# Patient Record
Sex: Female | Born: 1980 | Race: Black or African American | Hispanic: No | Marital: Single | Smoking: Never smoker
Health system: Southern US, Community
[De-identification: ages and names within clinical notes are randomized; demographics above are authoritative.]

## PROBLEM LIST (undated history)

## (undated) DIAGNOSIS — G43909 Migraine, unspecified, not intractable, without status migrainosus: Secondary | ICD-10-CM

---

## 2008-05-26 ENCOUNTER — Emergency Department (HOSPITAL_BASED_OUTPATIENT_CLINIC_OR_DEPARTMENT_OTHER): Admission: EM | Admit: 2008-05-26 | Discharge: 2008-05-26 | Payer: Self-pay | Admitting: Emergency Medicine

## 2009-07-15 ENCOUNTER — Emergency Department (HOSPITAL_BASED_OUTPATIENT_CLINIC_OR_DEPARTMENT_OTHER): Admission: EM | Admit: 2009-07-15 | Discharge: 2009-07-15 | Payer: Self-pay | Admitting: Emergency Medicine

## 2010-12-24 ENCOUNTER — Emergency Department (HOSPITAL_BASED_OUTPATIENT_CLINIC_OR_DEPARTMENT_OTHER)
Admission: EM | Admit: 2010-12-24 | Discharge: 2010-12-24 | Disposition: A | Discharge status: Home / Self Care | Payer: Self-pay | Attending: Emergency Medicine | Admitting: Emergency Medicine

## 2010-12-24 DIAGNOSIS — B373 Candidiasis of vulva and vagina: Secondary | ICD-10-CM | POA: Insufficient documentation

## 2010-12-24 DIAGNOSIS — R109 Unspecified abdominal pain: Secondary | ICD-10-CM | POA: Insufficient documentation

## 2010-12-24 DIAGNOSIS — B3731 Acute candidiasis of vulva and vagina: Secondary | ICD-10-CM | POA: Insufficient documentation

## 2010-12-24 LAB — URINALYSIS, ROUTINE W REFLEX MICROSCOPIC
Bilirubin Urine: NEGATIVE
Hgb urine dipstick: NEGATIVE
Nitrite: NEGATIVE
Protein, ur: NEGATIVE mg/dL
Specific Gravity, Urine: 1.021 (ref 1.005–1.030)
Urobilinogen, UA: 0.2 mg/dL (ref 0.0–1.0)

## 2010-12-24 LAB — WET PREP, GENITAL

## 2010-12-25 LAB — GC/CHLAMYDIA PROBE AMP, GENITAL: GC Probe Amp, Genital: NEGATIVE

## 2013-12-20 ENCOUNTER — Other Ambulatory Visit (HOSPITAL_COMMUNITY): Payer: Self-pay | Admitting: Obstetrics and Gynecology

## 2013-12-20 DIAGNOSIS — Z3689 Encounter for other specified antenatal screening: Secondary | ICD-10-CM

## 2013-12-20 DIAGNOSIS — O289 Unspecified abnormal findings on antenatal screening of mother: Secondary | ICD-10-CM

## 2014-01-08 ENCOUNTER — Other Ambulatory Visit: Payer: Self-pay

## 2014-01-08 ENCOUNTER — Ambulatory Visit (HOSPITAL_COMMUNITY)
Admission: RE | Admit: 2014-01-08 | Discharge: 2014-01-08 | Disposition: A | Discharge status: Home / Self Care | Payer: Medicaid Other | Source: Ambulatory Visit | Attending: Obstetrics and Gynecology | Admitting: Obstetrics and Gynecology

## 2014-01-08 ENCOUNTER — Encounter (HOSPITAL_COMMUNITY): Payer: Self-pay

## 2014-01-08 DIAGNOSIS — O289 Unspecified abnormal findings on antenatal screening of mother: Secondary | ICD-10-CM

## 2014-01-08 DIAGNOSIS — IMO0002 Reserved for concepts with insufficient information to code with codable children: Secondary | ICD-10-CM | POA: Insufficient documentation

## 2014-01-08 DIAGNOSIS — Z3689 Encounter for other specified antenatal screening: Secondary | ICD-10-CM

## 2014-01-08 NOTE — Progress Notes (Signed)
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  They were counseled regarding the First trimester screen result and the associated 1 in 21 risk for fetal Down syndrome.  We reviewed chromosomes, nondisjunction, and the common features and variable prognosis of Down syndrome.  In addition, the screen reduced the risk for trisomy 18 (1 in 1131).  We also discussed other explanations for a screen positive result including: differences in maternal metabolism and normal variation. They understand that this screening is not diagnostic for Down syndrome but provides a risk assessment. Amber Olsen subsequently had noninvasive prenatal screening (NIPS) through her initial OB office [facility]). [facility] NIPS[facility] was within normal limits for the conditions screened, showing a less than 1 in 10,000 risk for trisomies 21, 18 and 13, and monosomy X (Turner syndrome).  In addition, the risk for triploidy/vanishing twin and sex chromosome trisomies (47,XXX and 47,XXY) was also low risk.  We reviewed that this testing identifies > 99% of pregnancies with trisomy 2821, trisomy 713, sex chromosome trisomies (47,XXX and 47,XXY), and triploidy. The detection rate for trisomy 18 is 96%.  The detection rate for monosomy X is ~92%.  The false positive rate is <0.1% for all conditions. Testing was also consistent with female fetal sex.   We reviewed that this screen is not diagnostic for these conditions, nor does it assess for all chromosome or genetic conditions.   Detailed ultrasound was performed at the time of today's visit. We reviewed that visualized fetal anatomy appeared normal. Complete ultrasound results reported separately. They were also counseled regarding diagnostic testing via amniocentesis. We reviewed the approximate 1 in 300-500 risk for complications for amniocentesis, including spontaneous pregnancy loss. After careful consideration, Amber Olsen declined amniocentesis in the pregnancy. They understand that screening tests cannot rule out all birth defects or genetic syndromes. The patient was advised of this limitation and states she still does not want additional testing at this time.   Amber Olsen had routine screening for hemoglobinopathies via hemoglobin electrophoresis and complete blood count based on her African-American ancestry on 07/03/13, which revealed a hemoglobin A of 92%, hemoglobin A2 of 5.4%, and hemoglobin F of 2.6%.  Her MCV value was 77.1 and her MCH was 23.8.  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   We reviewed both family histories in detail. Amber Olsen reported that her niece (her brother's daughter) has beta thalassemia. She is currently 31 years old and reportedly had worse symptoms in childhood compare to current health status. The patient reported that her brother and her mother have beta-thalassemia trait. Amber Olsen reported no known family history of sickle cell trait, thalassemia trait, or other hemoglobin variants. He has not had screening for hemoglobin variants at this time. Consanguinity between the couple was denied.   The couple was counseled that beta-thalassemia major is a genetic condition characterized by reduced synthesis of the beta  subunit of  hemoglobin (beta globin). We reviewed that hemoglobin is a protein in red blood cells that carries oxygen to the body's organs. There are multiple types of hemoglobin, and these are comprised of different subunits.  The primary adult hemoglobin, denoted Olsen hemoglobin A (Hb A) is made up of two alpha and two beta globin units. Individuals who have beta-thalassemia major have changes within the genes that code for beta globin. The resulting imbalance in the ratio of the alpha chains to the beta chains causes the underlying pathology. In beta-thalassemia, the alpha chains are produced in relative excess. Therefore, because of the absence of the beta chains with which to form a tetramer, the excess alpha chains will precipitate in the cell damaging the membrane and leading to premature RBC destruction. This results in hypochromic, microcytic anemia. The anemia is severe and typically leads to hepatosplenomegaly for individuals with beta thalassemia.  Because the beta chain is not used for hemoglobin production during fetal life, the onset of symptoms in beta-thalassemia is not until a few months of life. Beta thalassemia major is typically treated with regular transfusions and chelation therapy, aimed at reducing transfusion iron overload.  Without treatment, individuals with beta-thalassemia major have failure to thrive, feeding problems, and a shortened lifespan. Elevation of hemoglobin A2 (alpha2/delta2) occurs uniquely in beta-thalassemia carriers, and this is what is typically found on hemoglobin electrophoresis for beta thalassemia carriers. Continued production of the delta chains allows for tetramer formation between the alpha and delta chains. Beta-thalassemia produces a variable phenotype dependent upon the severity of the mutations.    The couple was counseled that beta-thalassemia is inherited in an autosomal recessive manner, and occurs when both copies of the beta hemoglobin gene are changed. Typically,  one abnormal beta globin gene is inherited from each parent. We discussed that beta thalassemia trait can be inherited with other beta globin chain variants, such Olsen sickle cell trait (Hb Olsen) to also cause other variant hemoglobinopathies, such Olsen sickle beta-thalassemia.  Beta-thalassemia minor occurs when an individual has one altered copy of the beta globin gene and one typical working copy of the beta globin gene.  This is also referred to Olsen being a "carrier" of beta-thalassemia.  Carriers of recessive conditions typically do not have symptoms related to the condition, because they still have one functioning copy of the gene, and thus some production of the typical protein coded for by that gene.   Given the recessive inheritance, we discussed the importance of understanding the carrier status of the father of the pregnancy in order to accurately predict the risk of beta-thalassemia or other hemoglobinopathy in the fetus. We discussed the option of hemoglobin electrophoresis with a complete blood count and serum ferritin levels to determine whether he has beta-thalassemia trait, or other hemoglobin variant (including sickle cell trait). We discussed that this screening can be facilitated through our office, likely his physician's office or the patient's OB office. Additionally, we discussed that carrier screening is provided through [facility] in [place].  She understands that an accurate risk assessment cannot be performed without further information.  We discussed that approximately 1 in 12 individuals with African-American ancestry have beta-thalassemia minor or sickle cell trait.  This considered, the risk for beta-thalassemia or other hemoglobinopathy in the fetus is approximately 1 in 89 [1/12 (chance that the father of the pregnancy is a carrier) x 1 (chance that Amber Olsen is carrier) x  (chance that both would pass  on the changed gene)].  The father  of the pregnancy declined testing at the time of today's visit and stated that he may pursue this at a later date. If the father of the pregnancy has typical hemoglobin, then the pregnancy would not be at risk to inherit a hemoglobinopathy but would have a 1 in 2 chance to have beta-thalassemia trait like Amber Olsen.   We discussed that in the case that both parents are identified to be carriers for hemoglobin variants, prenatal diagnosis via amniocentesis would be available, if desired. In the case of beta-thalassemia trait, molecular testing would first need to be performed to identify the causative mutation in the HBB gene prior to CVS or amniocentesis, given that >200 pathogenic variants have been identified in the HBB gene. Hemoglobin S (or sickle cell trait) is encoded by a specific point mutation (Glu6Val) in the HBB gene.  HBB gene sequencing is available to Amber Olsen to assess for the causative gene change for her beta thalassemia trait. HBB sequencing reportedly identifies a causative mutation in 99% of individuals with beta thalassemia. The patient understands that targeted ultrasound in pregnancy would not be expected to see physical differences related to the presence or absence of a hemoglobinopathy. We also reviewed the availability of newborn screening in [place] for hemoglobinopathies. Amber Olsen again indicated that she would not be interested in amniocentesis in the pregnancy, even in the event that the father of the pregnancy was determined to carry a hemoglobin variant.   Both family histories were reviewed and found to be otherwise contributory for a female maternal first cousin to the father of the pregnancy (his maternal aunt's son) with autism. The etiology is unknown. He is reportedly in his 78's and lives with his mother. No additional relatives were reported with autism, including this relative's two brothers. We discussed that autism is part of the spectrum of conditions  referred to Olsen Autistic spectrum disorders (ASD). We discussed that ASDs are among the most common neurodevelopmental disorders, with approximately 1 in 88 children meeting criteria for ASD. Approximately 80% of individuals diagnosed are female. There is strong evidence that genetic factors play a critical role in development of ASD. There have been recent advances in identifying specific genetic causes of ASD, however, there are still many individuals for whom the etiology of the ASD is not known. Once a family has a child with a diagnosis of ASD, there is a 13.5% chance to have another child with ASD. Data are limited regarding recurrence risk estimate for extended degree relatives. However, given the degree of relation, we discussed that recurrence risk would likely be similar to the general population risk, in the case of multifactorial inheritance. They understand that at this time there is not genetic testing available for ASD for most families. Without further information regarding the provided family history, an accurate genetic risk cannot be calculated. Further genetic counseling is warranted if more information is obtained.  Amber Olsen denied exposure to environmental toxins or chemical agents. She denied the use of alcohol, tobacco or street drugs. She denied significant viral illnesses during the course of her pregnancy. Her medical and surgical histories were noncontributory.   I counseled this couple for approximately 30 minutes regarding the above risks and available options.   Helyn App Jeni Salles, MS,  Certified Genetic Counselor  01/08/2014

## 2014-06-03 ENCOUNTER — Encounter (HOSPITAL_COMMUNITY): Payer: Self-pay

## 2014-11-13 ENCOUNTER — Encounter (HOSPITAL_COMMUNITY): Payer: Self-pay | Admitting: *Deleted

## 2015-12-27 ENCOUNTER — Emergency Department (HOSPITAL_BASED_OUTPATIENT_CLINIC_OR_DEPARTMENT_OTHER): Payer: Medicaid Other

## 2015-12-27 ENCOUNTER — Emergency Department (HOSPITAL_BASED_OUTPATIENT_CLINIC_OR_DEPARTMENT_OTHER)
Admission: EM | Admit: 2015-12-27 | Discharge: 2015-12-28 | Disposition: A | Discharge status: Home / Self Care | Payer: Medicaid Other | Attending: Emergency Medicine | Admitting: Emergency Medicine

## 2015-12-27 ENCOUNTER — Encounter (HOSPITAL_BASED_OUTPATIENT_CLINIC_OR_DEPARTMENT_OTHER): Payer: Self-pay | Admitting: *Deleted

## 2015-12-27 DIAGNOSIS — S60221A Contusion of right hand, initial encounter: Secondary | ICD-10-CM

## 2015-12-27 DIAGNOSIS — S61411A Laceration without foreign body of right hand, initial encounter: Secondary | ICD-10-CM

## 2015-12-27 DIAGNOSIS — Y929 Unspecified place or not applicable: Secondary | ICD-10-CM | POA: Insufficient documentation

## 2015-12-27 DIAGNOSIS — S66921A Laceration of unspecified muscle, fascia and tendon at wrist and hand level, right hand, initial encounter: Secondary | ICD-10-CM | POA: Insufficient documentation

## 2015-12-27 DIAGNOSIS — Y999 Unspecified external cause status: Secondary | ICD-10-CM | POA: Insufficient documentation

## 2015-12-27 DIAGNOSIS — W268XXA Contact with other sharp object(s), not elsewhere classified, initial encounter: Secondary | ICD-10-CM | POA: Insufficient documentation

## 2015-12-27 DIAGNOSIS — Y93G1 Activity, food preparation and clean up: Secondary | ICD-10-CM | POA: Insufficient documentation

## 2015-12-27 NOTE — ED Notes (Signed)
MD Delo at bedside. 

## 2015-12-27 NOTE — ED Notes (Signed)
Pt. returned from XR. 

## 2015-12-27 NOTE — ED Provider Notes (Signed)
CSN: [id number]     Arrival date & time 12/27/15  2228 History  By signing my name below, I, Bridgette Habermann and Doreatha Martin, attest that this documentation has been prepared under the direction and in the presence of Geoffery Lyons, MD. Electronically Signed: Bridgette Habermann and Doreatha Martin, ED Scribe. 12/27/2015. 11:51 PM.      Chief Complaint  Patient presents with  . Hand Injury   Patient is a 35 y.o. female presenting with skin laceration. The history is provided by the patient. No language interpreter was used.  Laceration Location:  Hand Hand laceration location:  R hand and dorsum of R hand Length (cm):  2.5  Quality: straight   Bleeding: controlled   Time since incident:  3 hours Laceration mechanism:  Metal edge Pain details:    Quality:  Aching   Severity:  Moderate   Timing:  Constant Foreign body present:  No foreign bodies Relieved by:  Pressure Ineffective treatments:  None tried Tetanus status:  Out of date   HPI Comments: Amber Olsen is a 35 y.o. female who presents to the Emergency Department complaining of a laceration with controlled bleeding to the dorsum of the right hand that occurred 3 hours ago. Pt states she was cleaning a metal stove when a metal edge cut the dorsum of her right hand. Bleeding controlled with pressure dressing applied PTA. Tdap out of date. She denies numbness, weakness, paresthesia, additional injuries.   History reviewed. No pertinent past medical history. History reviewed. No pertinent past surgical history. History reviewed. No pertinent family history. Social History  Substance Use Topics  . Smoking status: Never Smoker   . Smokeless tobacco: None  . Alcohol Use: No   OB History    Gravida Para Term Preterm AB TAB SAB Ectopic Multiple Living   0 0 0 1     Review of Systems  Skin: Positive for wound.  Neurological: Negative for weakness and numbness.       -paresthesia  All other systems reviewed and are  negative.  Allergies  Review of patient's allergies indicates no known allergies.  Home Medications   Prior to Admission medications   Not on File   BP 129/83 mmHg  Pulse 82  Temp(Src) 98.9 F (37.2 C) (Oral)  Resp 20  Ht  (1.676 m)  Wt 150 lb (68.04 kg)  BMI 24.22 kg/m2  SpO2 100%  LMP 12/02/2015 (Exact Date) Physical Exam  Constitutional: She is oriented to person, place, and time. She appears well-developed and well-nourished.  HENT:  Head: Normocephalic and atraumatic.  Eyes: Pupils are equal, round, and reactive to light.  Neck: Normal range of motion. Neck supple.  Cardiovascular: Normal rate, regular rhythm and normal heart sounds.   Pulmonary/Chest: Effort normal and breath sounds normal. No respiratory distress. She has no wheezes. She has no rales. She exhibits no tenderness.  Abdominal: Soft. Bowel sounds are normal. There is no tenderness. There is no rebound and no guarding.  Musculoskeletal: Normal range of motion. She exhibits no edema.  Right hand has swelling to the dorsal aspect. There is a small 2.5 cm laceration to the area above the distal third and fourth metacarpals   Lymphadenopathy:    She has no cervical adenopathy.  Neurological: She is alert and oriented to person, place, and time.  Skin: Skin is warm and dry. No rash noted.  Psychiatric: She has a normal mood and affect.  Nursing note and  vitals reviewed.   ED Course  Procedures (including critical care time) DIAGNOSTIC STUDIES: Oxygen Saturation is 100% on RA, normal by my interpretation.    COORDINATION OF CARE: 11:48 PM Discussed treatment plan with pt at bedside which includes XR and pt agreed to plan.  LACERATION REPAIR PROCEDURE NOTE The patient's identification was confirmed and consent was obtained. This procedure was performed by Geoffery Lyonsouglas Emer Onnen, MD at 12:29 AM. Site: right hand Sterile procedures observed Anesthetic used (type and amt): 3 mL 1% lidocaine without epi Suture  type/size: 4-0 prolene  Length:2.5 cm  # of Sutures: 3 Technique: simple interrupted Complexity: simple Antibx ointment applied Tetanus ordered Site anesthetized, irrigated with NS, explored without evidence of foreign body, wound well approximated, site covered with dry, sterile dressing.  Patient tolerated procedure well without complications. Instructions for care discussed verbally and patient provided with additional written instructions for homecare and f/u.    Imaging Review Dg Hand Complete Right  12/28/2015  CLINICAL DATA:  Acute onset of right hand injury while cleaning stove, with diffuse swelling and small laceration at the proximal fourth metacarpal. Initial encounter. EXAM: RIGHT HAND - COMPLETE 3+ VIEW COMPARISON:  None. FINDINGS: There is no evidence of fracture or dislocation. The joint spaces are preserved. The carpal rows are intact, and demonstrate normal alignment. Diffuse dorsal soft tissue swelling is noted. No radiopaque foreign bodies are seen. IMPRESSION: No evidence of fracture or dislocation. No radiopaque foreign bodies seen. Electronically Signed   By: Roanna RaiderJeffery  Chang M.D.   On: 12/28/2015 00:09   I have personally reviewed and evaluated these images as part of my medical decision-making.   MDM   Final diagnoses:  Hand laceration, right, initial encounter  Hand contusion, right, initial encounter    The wound was copiously irrigated and closed with sutures as above. She will be discharged with local wound care and when necessary return. Sutures are to be removed in 7 days.  I asked the patient several times whether or not this was a result of punching someone and the laceration was from another individual's teeth. She repeatedly assured me that it was not.  I personally performed the services described in this documentation, which was scribed in my presence. The recorded information has been reviewed and is accurate.       Geoffery Lyonsouglas Serrita Lueth, MD 12/28/15  [phone]

## 2015-12-27 NOTE — ED Notes (Signed)
Per pt report cleaning the stove and cough the top of rt hand. Swelling present .

## 2015-12-28 MED ORDER — TETANUS-DIPHTH-ACELL PERTUSSIS 5-2.5-18.5 LF-MCG/0.5 IM SUSP
0.5000 mL | Freq: Once | INTRAMUSCULAR | Status: AC
  Administered 2015-12-28: 0.5 mL via INTRAMUSCULAR
  Filled 2015-12-28: qty 0.5

## 2015-12-28 MED ORDER — LIDOCAINE HCL 1 % IJ SOLN
INTRAMUSCULAR | Status: AC
  Filled 2015-12-28: qty 20

## 2015-12-28 MED ORDER — LIDOCAINE HCL (PF) 1 % IJ SOLN: 5.0000 mL | Freq: Once | INTRAMUSCULAR | Status: DC

## 2015-12-28 MED ORDER — TRAMADOL HCL 50 MG PO TABS: 50.0000 mg | ORAL_TABLET | Freq: Four times a day (QID) | ORAL | Status: DC | PRN

## 2015-12-28 NOTE — ED Notes (Signed)
Pt given d/c instructions as per chart. Verbalizes understanding. No questions. 

## 2015-12-28 NOTE — Discharge Instructions (Signed)
Local wound care with bacitracin and dressing changes twice daily.  Tramadol as prescribed as needed for pain.  Sutures are to be removed in 7 days.  Return to the emergency department for increased swelling, redness, pus straining from the wound, or increased pain.     Laceration Care, Adult A laceration is a cut that goes through all of the layers of the skin and into the tissue that is right under the skin. Some lacerations heal on their own. Others need to be closed with stitches (sutures), staples, skin adhesive strips, or skin glue. Proper laceration care minimizes the risk of infection and helps the laceration to heal better. HOW TO CARE FOR YOUR LACERATION If sutures or staples were used:  Keep the wound clean and dry.  If you were given a bandage (dressing), you should change it at least one time per day or as told by your health care provider. You should also change it if it becomes wet or dirty.  Keep the wound completely dry for the first 24 hours or as told by your health care provider. After that time, you may shower or bathe. However, make sure that the wound is not soaked in water until after the sutures or staples have been removed.  Clean the wound one time each day or as told by your health care provider:  Wash the wound with soap and water.  Rinse the wound with water to remove all soap.  Pat the wound dry with a clean towel. Do not rub the wound.  After cleaning the wound, apply a thin layer of antibiotic ointmentas told by your health care provider. This will help to prevent infection and keep the dressing from sticking to the wound.  Have the sutures or staples removed as told by your health care provider. If skin adhesive strips were used:  Keep the wound clean and dry.  If you were given a bandage (dressing), you should change it at least one time per day or as told by your health care provider. You should also change it if it becomes dirty or wet.  Do  not get the skin adhesive strips wet. You may shower or bathe, but be careful to keep the wound dry.  If the wound gets wet, pat it dry with a clean towel. Do not rub the wound.  Skin adhesive strips fall off on their own. You may trim the strips as the wound heals. Do not remove skin adhesive strips that are still stuck to the wound. They will fall off in time. If skin glue was used:  Try to keep the wound dry, but you may briefly wet it in the shower or bath. Do not soak the wound in water, such as by swimming.  After you have showered or bathed, gently pat the wound dry with a clean towel. Do not rub the wound.  Do not do any activities that will make you sweat heavily until the skin glue has fallen off on its own.  Do not apply liquid, cream, or ointment medicine to the wound while the skin glue is in place. Using those may loosen the film before the wound has healed.  If you were given a bandage (dressing), you should change it at least one time per day or as told by your health care provider. You should also change it if it becomes dirty or wet.  If a dressing is placed over the wound, be careful not to apply tape directly over  the skin glue. Doing that may cause the glue to be pulled off before the wound has healed.  Do not pick at the glue. The skin glue usually remains in place for 5-10 days, then it falls off of the skin. General Instructions  Take over-the-counter and prescription medicines only as told by your health care provider.  If you were prescribed an antibiotic medicine or ointment, take or apply it as told by your doctor. Do not stop using it even if your condition improves.  To help prevent scarring, make sure to cover your wound with sunscreen whenever you are outside after stitches are removed, after adhesive strips are removed, or when glue remains in place and the wound is healed. Make sure to wear a sunscreen of at least 30 SPF.  Do not scratch or pick at the  wound.  Keep all follow-up visits as told by your health care provider. This is important.  Check your wound every day for signs of infection. Watch for:  Redness, swelling, or pain.  Fluid, blood, or pus.  Raise (elevate) the injured area above the level of your heart while you are sitting or lying down, if possible. SEEK MEDICAL CARE IF:  You received a tetanus shot and you have swelling, severe pain, redness, or bleeding at the injection site.  You have a fever.  A wound that was closed breaks open.  You notice a bad smell coming from your wound or your dressing.  You notice something coming out of the wound, such as wood or glass.  Your pain is not controlled with medicine.  You have increased redness, swelling, or pain at the site of your wound.  You have fluid, blood, or pus coming from your wound.  You notice a change in the color of your skin near your wound.  You need to change the dressing frequently due to fluid, blood, or pus draining from the wound.  You develop a new rash.  You develop numbness around the wound. SEEK IMMEDIATE MEDICAL CARE IF:  You develop severe swelling around the wound.  Your pain suddenly increases and is severe.  You develop painful lumps near the wound or on skin that is anywhere on your body.  You have a red streak going away from your wound.  The wound is on your hand or foot and you cannot properly move a finger or toe.  The wound is on your hand or foot and you notice that your fingers or toes look pale or bluish.   This information is not intended to replace advice given to you by your health care provider. Make sure you discuss any questions you have with your health care provider.   Document Released: 07/19/2005 Document Revised: 12/03/2014 Document Reviewed: 07/15/2014 Elsevier Interactive Patient Education [employer]

## 2016-01-04 ENCOUNTER — Emergency Department (HOSPITAL_BASED_OUTPATIENT_CLINIC_OR_DEPARTMENT_OTHER)
Admission: EM | Admit: 2016-01-04 | Discharge: 2016-01-04 | Disposition: A | Discharge status: Home / Self Care | Payer: Medicaid Other | Attending: Emergency Medicine | Admitting: Emergency Medicine

## 2016-01-04 ENCOUNTER — Encounter (HOSPITAL_BASED_OUTPATIENT_CLINIC_OR_DEPARTMENT_OTHER): Payer: Self-pay | Admitting: *Deleted

## 2016-01-04 DIAGNOSIS — Z4802 Encounter for removal of sutures: Secondary | ICD-10-CM | POA: Insufficient documentation

## 2016-01-04 DIAGNOSIS — Z4889 Encounter for other specified surgical aftercare: Secondary | ICD-10-CM

## 2016-01-04 NOTE — ED Notes (Signed)
Pt given d/c instructions as per chart. Verbalizes understanding. No questions. 

## 2016-01-04 NOTE — Discharge Instructions (Signed)
Keep wound covered during day time. You can wash routinely. Return for worsening symptoms, including fever, increased redness/swelling, pus drainage, worsening pain, or any other symptoms concerning to you.  Incision Care An incision is when a surgeon cuts into your body. After surgery, the incision needs to be cared for properly to prevent infection.  HOW TO CARE FOR YOUR INCISION  Take medicines only as directed by your health care provider.  There are many different ways to close and cover an incision, including stitches, skin glue, and adhesive strips. Follow your health care provider's instructions on:  Incision care.  Bandage (dressing) changes and removal.  Incision closure removal.  Do not take baths, swim, or use a hot tub until your health care provider approves. You may shower as directed by your health care provider.  Resume your normal diet and activities as directed.  Use anti-itch medicine (such as an antihistamine) as directed by your health care provider. The incision may itch while it is healing. Do not pick or scratch at the incision.  Drink enough fluid to keep your urine clear or pale yellow. SEEK MEDICAL CARE IF:   You have drainage, redness, swelling, or pain at your incision site.  You have muscle aches, chills, or a general ill feeling.  You notice a bad smell coming from the incision or dressing.  Your incision edges separate after the sutures, staples, or skin adhesive strips have been removed.  You have persistent nausea or vomiting.  You have a fever.  You are dizzy. SEEK IMMEDIATE MEDICAL CARE IF:   You have a rash.  You faint.  You have difficulty breathing. MAKE SURE YOU:   Understand these instructions.  Will watch your condition.  Will get help right away if you are not doing well or get worse.   This information is not intended to replace advice given to you by your health care provider. Make sure you discuss any questions you  have with your health care provider.   Document Released: 02/05/2005 Document Revised: 08/09/2014 Document Reviewed: 09/12/2013 Elsevier Interactive Patient Education [employer]2016 Elsevier Inc.

## 2016-01-04 NOTE — ED Provider Notes (Signed)
CSN: [id number]     Arrival date & time 01/04/16  1411 History   First MD Initiated Contact with Patient 01/04/16 1428     Chief Complaint  Patient presents with  . Suture / Staple Removal     (Consider location/radiation/quality/duration/timing/severity/associated sxs/prior Treatment) HPI 35 year old female who presents for suture removal. No significant PMH. Presented to ED 12/27/2015 for laceration sustained to the dorsum of the right hand. Had laceration repair in the ED. Returns for suture removal. No significant pain, swelling, increased redness, drainage, fever or chills. NO numbness or weakness or limitation of range of motion.   History reviewed. No pertinent past medical history. History reviewed. No pertinent past surgical history. History reviewed. No pertinent family history. Social History  Substance Use Topics  . Smoking status: Never Smoker   . Smokeless tobacco: None  . Alcohol Use: No   OB History    Gravida Para Term Preterm AB TAB SAB Ectopic Multiple Living   0 0 0 1     Review of Systems  Constitutional: Negative for fever.  Musculoskeletal: Negative for arthralgias.  Skin: Positive for wound.  Allergic/Immunologic: Negative for immunocompromised state.  Hematological: Does not bruise/bleed easily.  All other systems reviewed and are negative.     Allergies  Review of patient's allergies indicates no known allergies.  Home Medications   Prior to Admission medications   Medication Sig Start Date End Date Taking? Authorizing Provider  traMADol (ULTRAM) 50 MG tablet Take 1 tablet (50 mg total) by mouth every 6 (six) hours as needed. 12/28/15   Geoffery Lyons, MD   BP 146/92 mmHg  Pulse 73  Temp(Src) 98.9 F (37.2 C) (Oral)  Resp 18  Ht  (1.676 m)  Wt 150 lb (68.04 kg)  BMI 24.22 kg/m2  SpO2 100%  LMP 12/02/2015 (Exact Date) Physical Exam  Physical Exam  Nursing note and vitals reviewed. Constitutional: Well developed, well  nourished, non-toxic, and in no acute distress Head: Normocephalic and atraumatic.  Mouth/Throat: Oropharynx is clear and moist.  Neck: Normal range of motion. Neck supple.  Cardiovascular: Normal rate and regular rhythm.   Pulmonary/Chest: Effort normal and breath sounds normal.  Abdominal: Soft. There is no tenderness. There is no rebound and no guarding.  Musculoskeletal: Normal range of motion.  Neurological: Alert, no facial droop, fluent speech, moves all extremities symmetrically, in tact sensation to light touch throughout, full innervation involving median, ulnar, and radial nerves of the right hand Skin: Skin is warm and dry. 2 cm v-shaped laceration over the dorsum of the right hand distal to 3rd/4th MCPs. 3 sutures in place. No drainage, surrounding erythema or swelling. Psychiatric: Cooperative   ED Course  .Suture Removal Date/Time: 01/04/2016 3:59 PM Performed by: Crista Curb DUO Authorized by: Crista Curb DUO Consent: Verbal consent obtained. Risks and benefits: risks, benefits and alternatives were discussed Consent given by: patient Body area: upper extremity Location details: right hand Wound Appearance: clean Sutures Removed: 3 Post-removal: dressing applied Facility: sutures placed in this facility Patient tolerance: Patient tolerated the procedure well with no immediate complications   (including critical care time) Labs Review Labs Reviewed - No data to display  Imaging Review No results found. I have personally reviewed and evaluated these images and lab results as part of my medical decision-making.   EKG Interpretation None      MDM   Final diagnoses:  Suture check  Visit for suture removal  Hand laceration repaired 5/27 who presents with suture removal. No complications suspected. No evidence of cellulitis. Hand neurovascularly in tact and with full ROM in tact. Sutures removed. Strict return and follow-up instructions reviewed. She expressed  understanding of all discharge instructions and felt comfortable with the plan of care.     Lavera Guiseana Duo Vincient Vanaman, MD 01/04/16 [phone]

## 2016-01-04 NOTE — ED Notes (Signed)
3 stitches in right hand that need to be removed today.  Site clean, dry and intact.

## 2016-09-13 ENCOUNTER — Emergency Department (HOSPITAL_BASED_OUTPATIENT_CLINIC_OR_DEPARTMENT_OTHER)
Admission: EM | Admit: 2016-09-13 | Discharge: 2016-09-13 | Disposition: A | Discharge status: Home / Self Care | Payer: 59 | Attending: Emergency Medicine | Admitting: Emergency Medicine

## 2016-09-13 ENCOUNTER — Emergency Department (HOSPITAL_BASED_OUTPATIENT_CLINIC_OR_DEPARTMENT_OTHER): Payer: 59

## 2016-09-13 ENCOUNTER — Encounter (HOSPITAL_BASED_OUTPATIENT_CLINIC_OR_DEPARTMENT_OTHER): Payer: Self-pay | Admitting: *Deleted

## 2016-09-13 DIAGNOSIS — J111 Influenza due to unidentified influenza virus with other respiratory manifestations: Secondary | ICD-10-CM

## 2016-09-13 DIAGNOSIS — R509 Fever, unspecified: Secondary | ICD-10-CM | POA: Diagnosis not present

## 2016-09-13 DIAGNOSIS — R69 Illness, unspecified: Secondary | ICD-10-CM

## 2016-09-13 DIAGNOSIS — R05 Cough: Secondary | ICD-10-CM | POA: Diagnosis not present

## 2016-09-13 DIAGNOSIS — M791 Myalgia: Secondary | ICD-10-CM | POA: Diagnosis not present

## 2016-09-13 DIAGNOSIS — J029 Acute pharyngitis, unspecified: Secondary | ICD-10-CM | POA: Diagnosis not present

## 2016-09-13 MED ORDER — IBUPROFEN 400 MG PO TABS
400.0000 mg | ORAL_TABLET | Freq: Once | ORAL | Status: AC
  Administered 2016-09-13: 400 mg via ORAL
  Filled 2016-09-13: qty 1

## 2016-09-13 MED ORDER — OSELTAMIVIR PHOSPHATE 75 MG PO CAPS: 75.0000 mg | ORAL_CAPSULE | Freq: Two times a day (BID) | ORAL | 0 refills | Status: DC

## 2016-09-13 NOTE — ED Notes (Addendum)
Patient c/o body aches, cough, fever & chills x3 days, treated with tylenol at home. Patient denies sick contacts. Patient is currently pain free. Patient is A&O x4, NAD noted.

## 2016-09-13 NOTE — Discharge Instructions (Signed)

## 2016-09-13 NOTE — ED Triage Notes (Signed)
Fever, cough, body aches and chills x 3 days.

## 2016-09-13 NOTE — ED Provider Notes (Signed)
[facility]   CSN: [id number] Arrival date & time: 09/13/16  1416  By signing my name below, I, Linna DarnerRussell Turner, attest that this documentation has been prepared under the direction and in the presence of Arthor CaptainAbigail Lowen Mansouri, PA-C. Electronically Signed: Linna Darnerussell Turner, Scribe. 09/13/2016. 5:23 PM.  History   Chief Complaint Chief Complaint  Patient presents with  . Fever    The history is provided by the patient. No language interpreter was used.     HPI Comments: Amber Olsen is a 36 y.o. female who presents to the Emergency Department complaining of a persistent fever (tmax 101.5) beginning three days ago. She reports associated cough, sore throat, and body aches. Pt has tried Tylenol Cold & Flu with transient relief of her symptoms. No known sick contacts with similar symptoms. Pt denies nausea, vomiting, or any other associated symptoms.  History reviewed. No pertinent past medical history.  There are no active problems to display for this patient.   History reviewed. No pertinent surgical history.  OB History    Gravida Para Term Preterm AB Living   4 1 1  0 2 1   SAB TAB Ectopic Multiple Live Births   1 1 0 0         Home Medications    Prior to Admission medications   Medication Sig Start Date End Date Taking? Authorizing Provider  traMADol (ULTRAM) 50 MG tablet Take 1 tablet (50 mg total) by mouth every 6 (six) hours as needed. 12/28/15   Geoffery Lyonsouglas Delo, MD    Family History No family history on file.  Social History Social History  Substance Use Topics  . Smoking status: Never Smoker  . Smokeless tobacco: Never Used  . Alcohol use No     Allergies   Patient has no known allergies.   Review of Systems Review of Systems  Constitutional: Positive for fever.  HENT: Positive for sore throat.   Respiratory: Positive for cough.   Gastrointestinal: Negative for nausea and vomiting.  Musculoskeletal: Positive for myalgias.      Physical Exam Updated Vital Signs BP 111/80 (BP Location: Left Arm)   Pulse 90   Temp 98.8 F (37.1 C)   Resp 20   Ht 5\' 6"  (1.676 m)   Wt 160 lb (72.6 kg)   LMP 08/30/2016   SpO2 100%   BMI 25.82 kg/m   Physical Exam  Constitutional: She is oriented to person, place, and time. She appears well-developed and well-nourished. No distress.  HENT:  Head: Normocephalic and atraumatic.  Eyes: Conjunctivae and EOM are normal.  Neck: Neck supple. No tracheal deviation present.  Cardiovascular: Normal rate.   Pulmonary/Chest: Effort normal. No respiratory distress.  Hyper-resonate in the left lower lobe.  Musculoskeletal: Normal range of motion.  Neurological: She is alert and oriented to person, place, and time.  Skin: Skin is warm and dry.  Psychiatric: She has a normal mood and affect. Her behavior is normal.  Nursing note and vitals reviewed.    ED Treatments / Results  Labs (all labs ordered are listed, but only abnormal results are displayed) Labs Reviewed - No data to display  EKG  EKG Interpretation None       Radiology No results found.  Procedures Procedures (including critical care time)  DIAGNOSTIC STUDIES: Oxygen Saturation is 100% on RA, normal by my interpretation.    COORDINATION OF CARE: 5:27 PM Discussed treatment plan with pt at bedside and pt agreed to plan.  Medications Ordered  in ED Medications  ibuprofen (ADVIL,MOTRIN) tablet 400 mg (400 mg Oral Given 09/13/16 1436)     Initial Impression / Assessment and Plan / ED Course  I have reviewed the triage vital signs and the nursing notes.  Pertinent labs & imaging results that were available during my care of the patient were reviewed by me and considered in my medical decision making (see chart for details).     Patient with symptoms consistent with influenza.  Vitals are stable, low-grade fever.  No signs of dehydration, tolerating PO's.  Per cdc recommendation will discharge with  Tamiflu.  The patient understands that symptoms are greater than the recommended 24-48 hour window of treatment.  Patient will be discharged with instructions to orally hydrate, rest, and use over-the-counter medications such as anti-inflammatories ibuprofen and Aleve for muscle aches and Tylenol for fever.  Patient will also be given a cough suppressant.    Final Clinical Impressions(s) / ED Diagnoses   Final diagnoses:  None    New Prescriptions New Prescriptions   No medications on file   I personally performed the services described in this documentation, which was scribed in my presence. The recorded information has been reviewed and is accurate.       Arthor Captain, PA-C 09/13/16 1759    Lyndal Pulley, MD 09/14/16 [phone]

## 2016-09-15 ENCOUNTER — Encounter: Payer: Self-pay | Admitting: *Deleted

## 2016-09-15 ENCOUNTER — Emergency Department (INDEPENDENT_AMBULATORY_CARE_PROVIDER_SITE_OTHER)
Admission: EM | Admit: 2016-09-15 | Discharge: 2016-09-15 | Disposition: A | Discharge status: Home / Self Care | Payer: 59 | Source: Home / Self Care | Attending: Family Medicine | Admitting: Family Medicine

## 2016-09-15 DIAGNOSIS — J111 Influenza due to unidentified influenza virus with other respiratory manifestations: Secondary | ICD-10-CM

## 2016-09-15 NOTE — ED Triage Notes (Signed)
Pt c/o fatigue and cough still from her flu dx 2 days ago. She reports temp 101.5 yesterday.

## 2016-09-15 NOTE — Discharge Instructions (Signed)

## 2016-09-15 NOTE — ED Provider Notes (Signed)
CSN: [id number]     Arrival date & time 09/15/16  1211 History   First MD Initiated Contact with Patient 09/15/16 1238     Chief Complaint  Patient presents with  . Fatigue  . Cough   (Consider location/radiation/quality/duration/timing/severity/associated sxs/prior Treatment) HPI  Amber Olsen is a 36 y.o. female presenting to UC with c/o continued fatigue, body aches, cough and congestion.  Fever Tmax 101.5*F yesterday.  Pt was seen at [facility] Emergency Department 2 days ago, dx with influenza. She did not start taking the recommended tamiflu due to concern with cost.  Denies feeling worse but states she does not feel much better. She has been taking acetaminophen intermittently. Minimal nausea. Denies vomiting or diarrhea. Denies chest pain or SOB.     History reviewed. No pertinent past medical history. History reviewed. No pertinent surgical history. History reviewed. No pertinent family history. Social History  Substance Use Topics  . Smoking status: Never Smoker  . Smokeless tobacco: Never Used  . Alcohol use No   OB History    Gravida Para Term Preterm AB Living   4 1 1  0 2 1   SAB TAB Ectopic Multiple Live Births   1 1 0 0       Review of Systems  Constitutional: Positive for chills, fatigue and fever.  HENT: Positive for congestion. Negative for ear pain, sore throat, trouble swallowing and voice change.   Respiratory: Positive for cough. Negative for shortness of breath.   Cardiovascular: Negative for chest pain and palpitations.  Gastrointestinal: Positive for nausea. Negative for abdominal pain, diarrhea and vomiting.  Musculoskeletal: Positive for arthralgias, back pain and myalgias.  Skin: Negative for rash.    Allergies  Patient has no known allergies.  Home Medications   Prior to Admission medications   Not on File   Meds Ordered and Administered this Visit  Medications - No data to display  BP 131/89 (BP Location: Left Arm)    Pulse 80   Temp 98.2 F (36.8 C) (Oral)   Resp 16   Ht 5\' 6"  (1.676 m)   Wt 160 lb (72.6 kg)   LMP 08/30/2016   SpO2 100%   BMI 25.82 kg/m  No data found.   Physical Exam  Constitutional: She is oriented to person, place, and time. She appears well-developed and well-nourished. No distress.  HENT:  Head: Normocephalic and atraumatic.  Right Ear: Tympanic membrane normal.  Left Ear: Tympanic membrane normal.  Nose: No mucosal edema. Right sinus exhibits no maxillary sinus tenderness and no frontal sinus tenderness. Left sinus exhibits no maxillary sinus tenderness and no frontal sinus tenderness.  Mouth/Throat: Uvula is midline, oropharynx is clear and moist and mucous membranes are normal.  Eyes: EOM are normal.  Neck: Normal range of motion. Neck supple.  Cardiovascular: Normal rate and regular rhythm.   Pulmonary/Chest: Effort normal and breath sounds normal. No stridor. No respiratory distress. She has no wheezes. She has no rales.  Abdominal: Soft. She exhibits no distension. There is no tenderness.  Musculoskeletal: Normal range of motion.  Lymphadenopathy:    She has no cervical adenopathy.  Neurological: She is alert and oriented to person, place, and time.  Skin: Skin is warm and dry. She is not diaphoretic.  Psychiatric: She has a normal mood and affect. Her behavior is normal.  Nursing note and vitals reviewed.   Urgent Care Course     Procedures (including critical care time)  Labs Review Labs Reviewed - No  data to display  Imaging Review Dg Chest 2 View  Result Date: 09/13/2016 CLINICAL DATA:  36 year old with three-day history of fever, cough, chills and body aches. EXAM: CHEST  2 VIEW COMPARISON:  None. FINDINGS: Cardiomediastinal silhouette unremarkable. Lungs clear. Bronchovascular markings normal. Pulmonary vascularity normal. No visible pleural effusions. No pneumothorax. Slight upper thoracic dextroscoliosis and thoracolumbar levoscoliosis. IMPRESSION:  No acute cardiopulmonary disease. Electronically Signed   By: Hulan Saashomas  Lawrence M.D.   On: 09/13/2016 17:42     MDM   1. Influenza    Pt presenting to UC for f/u after dx of influenza 2 days ago in ED. Medical records reviewed including negative CXR. Vitals: WNL today, O2 Sat 100% on RA. Pt is afebrile. Lungs: CTAB No evidence of secondary bacterial infection at this time. Pt requested FMLA papers be filled out. Forms filled out. Work note provided for today and tomorrow. Encouraged continued symptomatic treatment with acetaminophen, ibuprofen, fluids, and rest. F/u with PCP, resource guide provided, as needed.    Junius Finnerrin O'Malley, PA-C 09/15/16 1347

## 2016-12-21 ENCOUNTER — Emergency Department (HOSPITAL_BASED_OUTPATIENT_CLINIC_OR_DEPARTMENT_OTHER)
Admission: EM | Admit: 2016-12-21 | Discharge: 2016-12-22 | Disposition: A | Discharge status: Home / Self Care | Payer: 59 | Attending: Emergency Medicine | Admitting: Emergency Medicine

## 2016-12-21 ENCOUNTER — Encounter (HOSPITAL_BASED_OUTPATIENT_CLINIC_OR_DEPARTMENT_OTHER): Payer: Self-pay | Admitting: Emergency Medicine

## 2016-12-21 DIAGNOSIS — G43009 Migraine without aura, not intractable, without status migrainosus: Secondary | ICD-10-CM | POA: Diagnosis not present

## 2016-12-21 DIAGNOSIS — R42 Dizziness and giddiness: Secondary | ICD-10-CM | POA: Diagnosis present

## 2016-12-21 LAB — URINALYSIS, ROUTINE W REFLEX MICROSCOPIC
Bilirubin Urine: NEGATIVE
GLUCOSE, UA: NEGATIVE mg/dL
HGB URINE DIPSTICK: NEGATIVE
Ketones, ur: NEGATIVE mg/dL
LEUKOCYTES UA: NEGATIVE
Nitrite: NEGATIVE
PH: 7 (ref 5.0–8.0)
Protein, ur: NEGATIVE mg/dL
Specific Gravity, Urine: 1.013 (ref 1.005–1.030)

## 2016-12-21 LAB — PREGNANCY, URINE: PREG TEST UR: NEGATIVE

## 2016-12-21 MED ORDER — SODIUM CHLORIDE 0.9 % IV BOLUS (SEPSIS)
1000.0000 mL | Freq: Once | INTRAVENOUS | Status: AC
  Administered 2016-12-21: 1000 mL via INTRAVENOUS

## 2016-12-21 MED ORDER — KETOROLAC TROMETHAMINE 30 MG/ML IJ SOLN
30.0000 mg | Freq: Once | INTRAMUSCULAR | Status: AC
  Administered 2016-12-21: 30 mg via INTRAVENOUS
  Filled 2016-12-21: qty 1

## 2016-12-21 NOTE — ED Triage Notes (Signed)
Pt c/o HA, dizziness and blurred vision x3 days.

## 2016-12-21 NOTE — ED Notes (Signed)
Pt standing at 3 minutes BP-135/102    Pulse-77

## 2016-12-21 NOTE — ED Provider Notes (Signed)
[facility]   CSN: [id number] Arrival date & time: 12/21/16  2057  By signing my name below, I, Thelma Bargeick Cochran, attest that this documentation has been prepared under the direction and in the presence of Shir Bergman, Mayer Maskerourtney F, MD. Electronically Signed: Thelma Bargeick Cochran, Scribe. 12/21/16. 11:24 PM.  History   Chief Complaint Chief Complaint  Patient presents with  . Dizziness   The history is provided by the patient. No language interpreter was used.    HPI Comments: Amber Olsen is a 36 y.o. female with a PMHx of headaches who presents to the Emergency Department complaining of constant, gradually worsening HA since 3 days. She has associated dizziness that she describes as being off balance, blurred vision in both eyes that she describes as a dimness, HA, and mild numbness in both hands. She denies associated photophobia, nausea, vomiting and diarrhea. Pt denies wearing corrective lenses, any medications taken, and NKDA. Pt notes a FMHx of DM.Patient has a history of headaches. States that this is somewhat different. She denies worst headache of her life. Current pain is 4 out of 10.  History reviewed. No pertinent past medical history.  There are no active problems to display for this patient.   History reviewed. No pertinent surgical history.  OB History    Gravida Para Term Preterm AB Living   4 1 1  0 2 1   SAB TAB Ectopic Multiple Live Births   1 1 0 0         Home Medications    Prior to Admission medications   Not on File    Family History No family history on file.  Social History Social History  Substance Use Topics  . Smoking status: Never Smoker  . Smokeless tobacco: Never Used  . Alcohol use No     Allergies   Patient has no known allergies.   Review of Systems Review of Systems  Constitutional: Negative for fever.  Eyes: Positive for visual disturbance (blurred vision). Negative for photophobia.  Gastrointestinal:  Negative for diarrhea, nausea and vomiting.  Musculoskeletal: Negative for neck pain.  Neurological: Positive for dizziness and headaches.  All other systems reviewed and are negative.    Physical Exam Updated Vital Signs BP 131/86   Pulse 73   Temp 98.7 F (37.1 C) (Oral)   Resp 15   Ht 5\' 6"  (1.676 m)   Wt 73.9 kg (163 lb)   LMP 11/30/2016 (Approximate)   SpO2 100%   BMI 26.31 kg/m   Physical Exam  Constitutional: She is oriented to person, place, and time. She appears well-developed and well-nourished. No distress.  HENT:  Head: Normocephalic and atraumatic.  Eyes: EOM are normal. Pupils are equal, round, and reactive to light.  Neck: Normal range of motion. Neck supple.  No meningismus  Cardiovascular: Normal rate, regular rhythm and normal heart sounds.   Pulmonary/Chest: Effort normal and breath sounds normal. No respiratory distress. She has no wheezes.  Abdominal: Soft. Bowel sounds are normal.  Neurological: She is alert and oriented to person, place, and time.  Cranial nerves II through XII intact, visual fields grossly intact, 5 out of 5 strength in all 470s, normal gait, no dysmetria to finger-nose-finger  Skin: Skin is warm and dry.  Psychiatric: She has a normal mood and affect.  Nursing note and vitals reviewed.    ED Treatments / Results  DIAGNOSTIC STUDIES: Oxygen Saturation is 100% on RA, normal by my interpretation.    COORDINATION OF CARE:  11:16 PM Discussed treatment plan with pt at bedside and pt agreed to plan. Labs (all labs ordered are listed, but only abnormal results are displayed) Labs Reviewed  URINALYSIS, ROUTINE W REFLEX MICROSCOPIC  PREGNANCY, URINE    EKG  EKG Interpretation  Date/Time:  Tuesday Dec 21 2016 23:29:23 EDT Ventricular Rate:  72 PR Interval:    QRS Duration: 86 QT Interval:  390 QTC Calculation: 427 R Axis:   79 Text Interpretation:  Sinus rhythm Confirmed by Ross Marcus ([id number]) on 12/22/2016 12:27:52 AM        Radiology No results found.  Procedures Procedures (including critical care time)  Medications Ordered in ED Medications  ketorolac (TORADOL) 30 MG/ML injection 30 mg (30 mg Intravenous Given 12/21/16 2357)  sodium chloride 0.9 % bolus 1,000 mL (0 mLs Intravenous Stopped 12/22/16 0110)     Initial Impression / Assessment and Plan / ED Course  I have reviewed the triage vital signs and the nursing notes.  Pertinent labs & imaging results that were available during my care of the patient were reviewed by me and considered in my medical decision making (see chart for details).     Patient presents with headache, dizziness, blurred vision. History of headaches but no known history of migraines with similar symptoms. She is nontoxic. Neurologic exam is largely reassuring. Visual acuity is 20/25 in both eyes, right 20/50, left 20/30.  Patient was given fluids. Orthostatics negative. She was also given Toradol. No significant dehydration noted on urinalysis. Doubt meningitis or subarachnoid hemorrhage.  On recheck, patient states that she feels much better. She remains neurologically intact. She denies any blurry vision or dizziness. Suspect she may have had a migraine. Follow-up with primary physician recommended.  After history, exam, and medical workup I feel the patient has been appropriately medically screened and is safe for discharge home. Pertinent diagnoses were discussed with the patient. Patient was given return precautions.   Final Clinical Impressions(s) / ED Diagnoses   Final diagnoses:  Migraine without aura and without status migrainosus, not intractable    New Prescriptions New Prescriptions   No medications on file   I personally performed the services described in this documentation, which was scribed in my presence. The recorded information has been reviewed and is accurate.     Shon Baton, MD 12/22/16 [phone]

## 2016-12-21 NOTE — ED Notes (Signed)
Pt lying flat to prepare for orthostatic vital signs.

## 2016-12-22 NOTE — ED Notes (Signed)
Pt verbalizes understanding of d/c instructions and denies any further needs at this time. 

## 2017-03-27 ENCOUNTER — Encounter: Payer: Self-pay | Admitting: Emergency Medicine

## 2017-03-27 ENCOUNTER — Emergency Department (INDEPENDENT_AMBULATORY_CARE_PROVIDER_SITE_OTHER)
Admission: EM | Admit: 2017-03-27 | Discharge: 2017-03-27 | Disposition: A | Discharge status: Home / Self Care | Payer: 59 | Source: Home / Self Care

## 2017-03-27 DIAGNOSIS — H66002 Acute suppurative otitis media without spontaneous rupture of ear drum, left ear: Secondary | ICD-10-CM | POA: Diagnosis not present

## 2017-03-27 MED ORDER — AMOXICILLIN 500 MG PO CAPS: 500.0000 mg | ORAL_CAPSULE | Freq: Three times a day (TID) | ORAL | 0 refills | Status: DC

## 2017-03-27 NOTE — ED Triage Notes (Signed)
Patient complaining of left ear pain x 1 day, dizziness, off balance, stuffy nose, low grade fever

## 2017-03-27 NOTE — ED Provider Notes (Signed)
[facility]    CSN: [id number] Arrival date & time: 03/27/17  1319     History   Chief Complaint Chief Complaint  Patient presents with  . Otalgia    HPI Ellysia Finlinson is a 36 y.o. female.   The history is provided by the patient. No language interpreter was used.  Otalgia  Location:  Left Behind ear:  No abnormality Quality:  Aching Severity:  Moderate Onset quality:  Gradual Duration:  1 day Timing:  Constant Progression:  Worsening Chronicity:  New Relieved by:  Nothing Worsened by:  Nothing Ineffective treatments:  None tried Associated symptoms: rhinorrhea   Associated symptoms: no fever and no sore throat     History reviewed. No pertinent past medical history.  There are no active problems to display for this patient.   History reviewed. No pertinent surgical history.  OB History    Gravida Para Term Preterm AB Living   4 1 1  0 2 1   SAB TAB Ectopic Multiple Live Births   1 1 0 0         Home Medications    Prior to Admission medications   Medication Sig Start Date End Date Taking? Authorizing Provider  SUMAtriptan Succinate (IMITREX PO) Take by mouth.   Yes [provider]  Topiramate (TOPAMAX PO) Take by mouth.   Yes [provider]  amoxicillin (AMOXIL) 500 MG capsule Take 1 capsule (500 mg total) by mouth 3 (three) times daily. 03/27/17   Elson Areas, PA-C    Family History No family history on file.  Social History Social History  Substance Use Topics  . Smoking status: Never Smoker  . Smokeless tobacco: Never Used  . Alcohol use No     Allergies   Patient has no known allergies.   Review of Systems Review of Systems  Constitutional: Negative for fever.  HENT: Positive for ear pain and rhinorrhea. Negative for sore throat.   All other systems reviewed and are negative.    Physical Exam Triage Vital Signs ED Triage Vitals  Enc Vitals Group     BP 03/27/17 1339 134/89     Pulse  Rate 03/27/17 1339 76     Resp --      Temp 03/27/17 1339 99.2 F (37.3 C)     Temp Source 03/27/17 1339 Oral     SpO2 03/27/17 1339 100 %     Weight 03/27/17 1340 164 lb (74.4 kg)     Height 03/27/17 1340 5\' 7"  (1.702 m)     Head Circumference --      Peak Flow --      Pain Score 03/27/17 1340 6     Pain Loc --      Pain Edu? --      Excl. in GC? --    No data found.   Updated Vital Signs BP 134/89 (BP Location: Left Arm)   Pulse 76   Temp 99.2 F (37.3 C) (Oral)   Ht 5\' 7"  (1.702 m)   Wt 164 lb (74.4 kg)   LMP 03/26/2017 (Exact Date)   SpO2 100%   BMI 25.69 kg/m   Visual Acuity Right Eye Distance:   Left Eye Distance:   Bilateral Distance:    Right Eye Near:   Left Eye Near:    Bilateral Near:     Physical Exam  Constitutional: She appears well-developed and well-nourished. No distress.  HENT:  Head: Normocephalic and atraumatic.  Nose: Nose normal.  Mouth/Throat: Oropharynx is clear and moist.  Left tm erythematous  Eyes: Pupils are equal, round, and reactive to light. Conjunctivae are normal.  Neck: Neck supple.  Cardiovascular: Normal rate and regular rhythm.   No murmur heard. Pulmonary/Chest: Effort normal and breath sounds normal. No respiratory distress.  Abdominal: Soft. There is no tenderness.  Musculoskeletal: She exhibits no edema.  Neurological: She is alert.  Skin: Skin is warm and dry.  Psychiatric: She has a normal mood and affect.  Nursing note and vitals reviewed.    UC Treatments / Results  Labs (all labs ordered are listed, but only abnormal results are displayed) Labs Reviewed - No data to display  EKG  EKG Interpretation None       Radiology No results found.  Procedures Procedures (including critical care time)  Medications Ordered in UC Medications - No data to display   Initial Impression / Assessment and Plan / UC Course  I have reviewed the triage vital signs and the nursing notes.  Pertinent labs &  imaging results that were available during my care of the patient were reviewed by me and considered in my medical decision making (see chart for details).       Final Clinical Impressions(s) / UC Diagnoses   Final diagnoses:  Acute suppurative otitis media of left ear without spontaneous rupture of tympanic membrane, recurrence not specified    New Prescriptions Discharge Medication List as of 03/27/2017  2:12 PM    START taking these medications   Details  amoxicillin (AMOXIL) 500 MG capsule Take 1 capsule (500 mg total) by mouth 3 (three) times daily., Starting Sun 03/27/2017, Normal       An After Visit Summary was printed and given to the patient.  Controlled Substance Prescriptions [place] Controlled Substance Registry consulted? Not Applicable   Elson Areas, [place] 03/27/17 1651

## 2017-03-27 NOTE — Discharge Instructions (Signed)
Return if any problems.  See your Physician for recheck in 1 week   °

## 2017-07-14 IMAGING — CR DG HAND COMPLETE 3+V*R*
3 series · 3 of 3 positions shown · non-contrast
Comparison: None.

CLINICAL DATA: Acute onset of right hand injury while cleaning
stove, with diffuse swelling and small laceration at the proximal
fourth metacarpal. Initial encounter.

EXAM:
RIGHT HAND - COMPLETE 3+ VIEW

[x hand pa right]
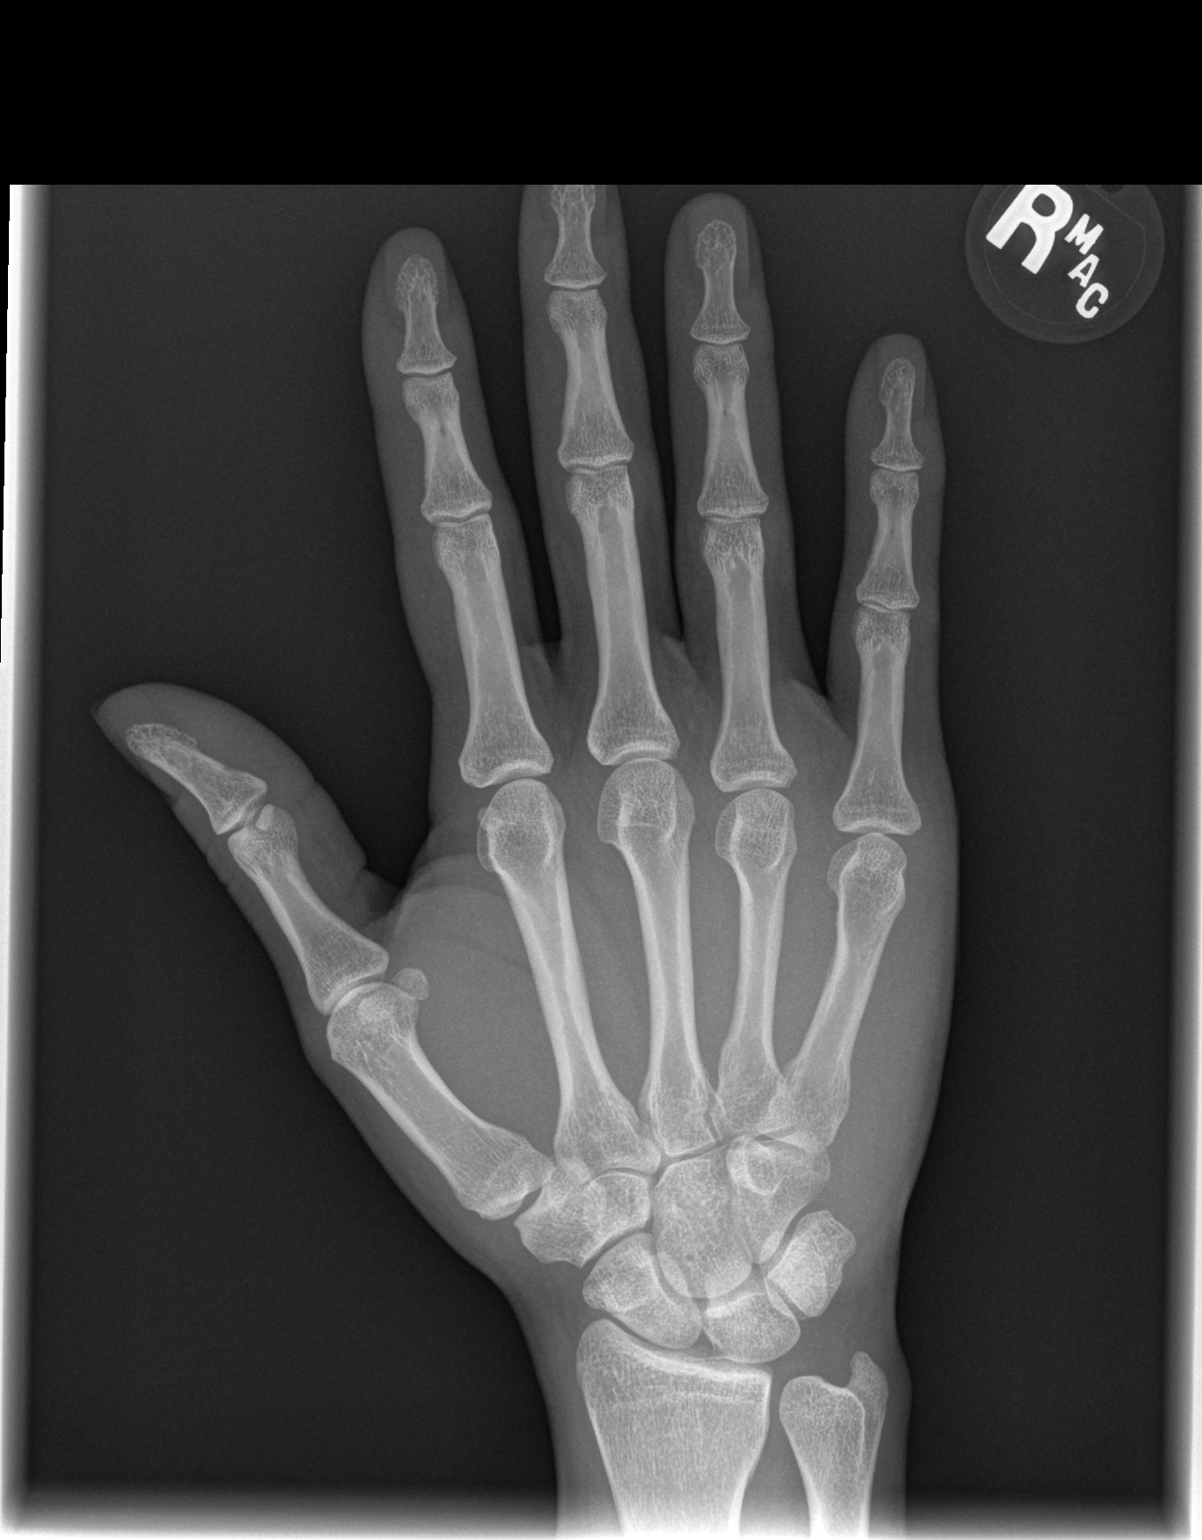

[x hand oblique right]
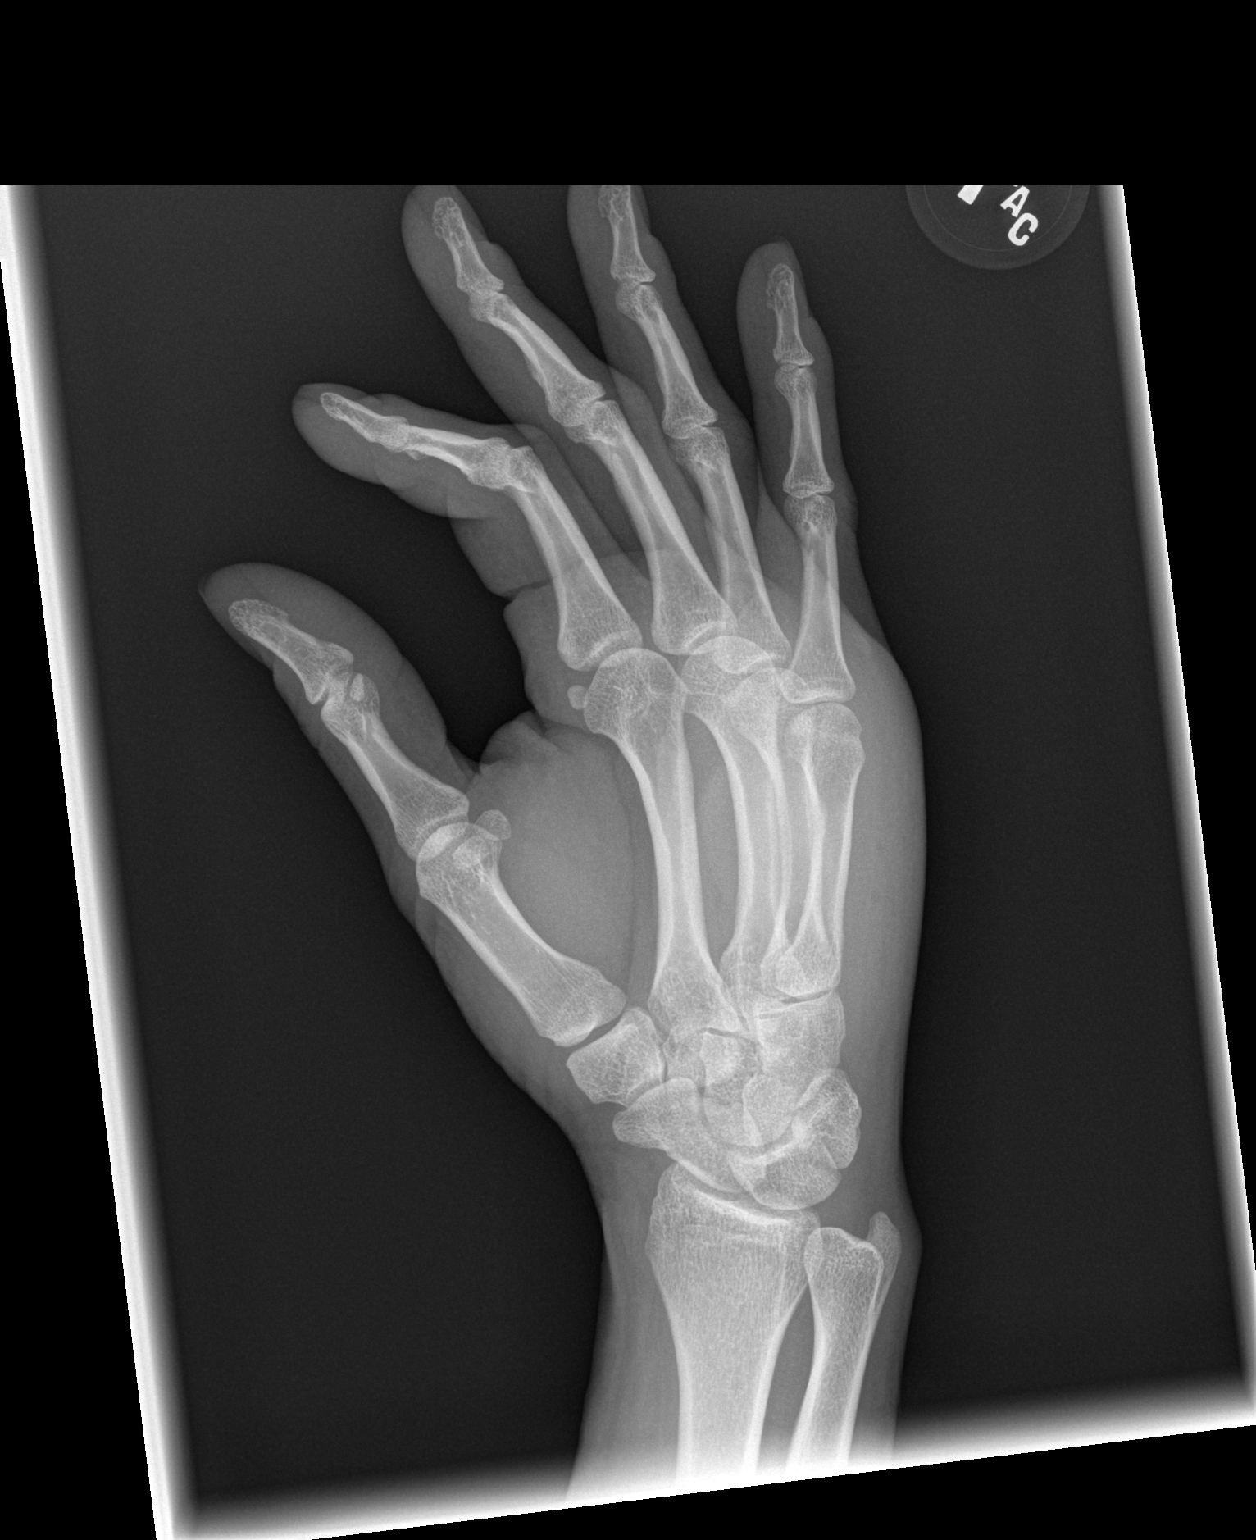

[x hand lat right]
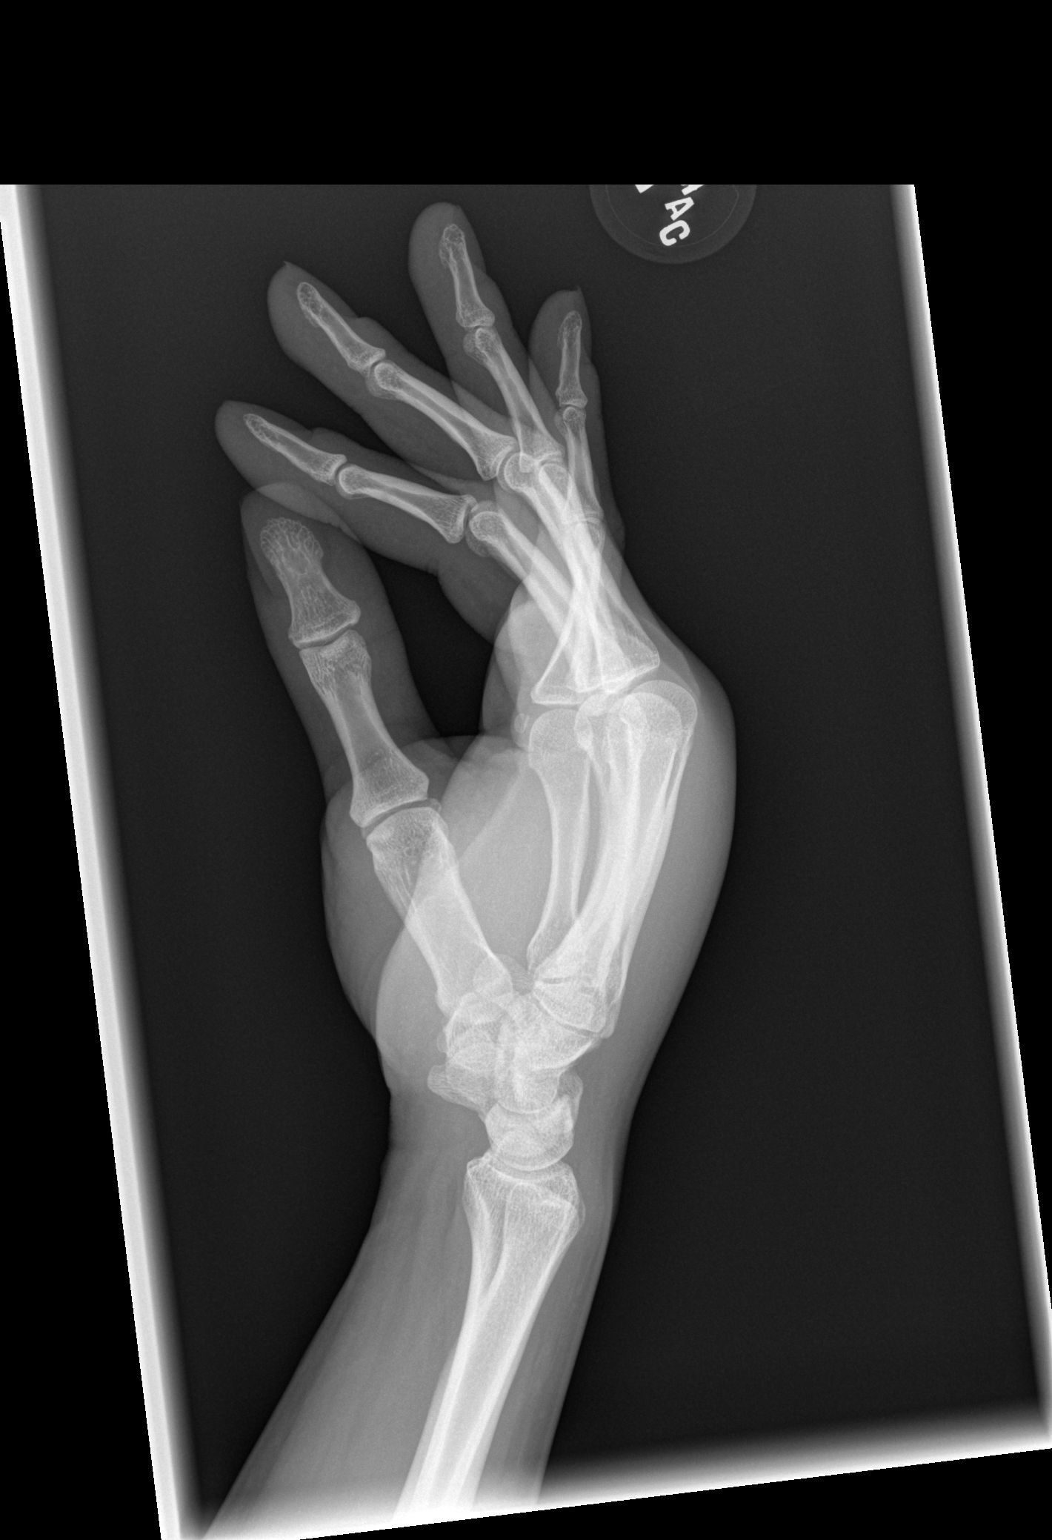

[3 of 3 positions shown; findings below may reference images not displayed]

FINDINGS: There is no evidence of fracture or dislocation. The joint spaces
are preserved. The carpal rows are intact, and demonstrate normal
alignment. Diffuse dorsal soft tissue swelling is noted. No
radiopaque foreign bodies are seen.
IMPRESSION: No evidence of fracture or dislocation. No radiopaque foreign bodies
seen.

## 2017-09-08 ENCOUNTER — Emergency Department (INDEPENDENT_AMBULATORY_CARE_PROVIDER_SITE_OTHER)
Admission: EM | Admit: 2017-09-08 | Discharge: 2017-09-08 | Disposition: A | Discharge status: Home / Self Care | Payer: 59 | Source: Home / Self Care | Attending: Family Medicine | Admitting: Family Medicine

## 2017-09-08 ENCOUNTER — Encounter: Payer: Self-pay | Admitting: Emergency Medicine

## 2017-09-08 ENCOUNTER — Other Ambulatory Visit: Payer: Self-pay

## 2017-09-08 DIAGNOSIS — R69 Illness, unspecified: Secondary | ICD-10-CM

## 2017-09-08 DIAGNOSIS — J111 Influenza due to unidentified influenza virus with other respiratory manifestations: Secondary | ICD-10-CM

## 2017-09-08 LAB — POCT RAPID STREP A (OFFICE): RAPID STREP A SCREEN: NEGATIVE

## 2017-09-08 MED ORDER — OSELTAMIVIR PHOSPHATE 75 MG PO CAPS: 75.0000 mg | ORAL_CAPSULE | Freq: Two times a day (BID) | ORAL | 0 refills | Status: DC

## 2017-09-08 MED ORDER — IBUPROFEN 600 MG PO TABS
600.0000 mg | ORAL_TABLET | Freq: Once | ORAL | Status: AC
  Administered 2017-09-08: 600 mg via ORAL

## 2017-09-08 NOTE — ED Triage Notes (Signed)
Patient reports onset yesterday of fever, sore throat, cough and low back pain; no OTCs today.

## 2017-09-08 NOTE — Discharge Instructions (Signed)
Take plain guaifenesin (1200mg extended release tabs such as Mucinex) twice daily, with plenty of water, for cough and congestion.  May add Pseudoephedrine (30mg, one or two every 4 to 6 hours) for sinus congestion.  Get adequate rest.   °May use Afrin nasal spray (or generic oxymetazoline) each morning for about 5 days and then discontinue.  Also recommend using saline nasal spray several times daily and saline nasal irrigation (AYR is a common brand).   °Try warm salt water gargles for sore throat.  °Stop all antihistamines for now, and other non-prescription cough/cold preparations. °May take Ibuprofen 200mg, 4 tabs every 8 hours with food for fever, body aches, headache, etc. °May take Delsym Cough Suppressant at bedtime for nighttime cough.  °

## 2017-09-08 NOTE — ED Provider Notes (Signed)
[facility]    CSN: [id number] Arrival date & time: 09/08/17  1504     History   Chief Complaint Chief Complaint  Patient presents with  . Fever  . Back Pain  . Sore Throat  . Cough    HPI Amber Olsen is a 37 y.o. female.   Yesterday patient developed flu-like illness including myalgias, low back pain, chills, fatigue, and cough.  Also has mild nasal congestion and sore throat.  Cough is non-productive and somewhat worse at night.  No pleuritic pain or shortness of breath.   She had diarrhea briefly, now resolved.   The history is provided by the patient.    History reviewed. No pertinent past medical history.  There are no active problems to display for this patient.   History reviewed. No pertinent surgical history.  OB History    Gravida Para Term Preterm AB Living   4 1 1  0 2 1   SAB TAB Ectopic Multiple Live Births   1 1 0 0         Home Medications    Prior to Admission medications   Medication Sig Start Date End Date Taking? Authorizing Provider  amoxicillin (AMOXIL) 500 MG capsule Take 1 capsule (500 mg total) by mouth 3 (three) times daily. 03/27/17   Elson Areas, PA-C  oseltamivir (TAMIFLU) 75 MG capsule Take 1 capsule (75 mg total) by mouth every 12 (twelve) hours. 09/08/17   Lattie Haw, MD  SUMAtriptan Succinate (IMITREX PO) Take by mouth.    [provider]  Topiramate (TOPAMAX PO) Take by mouth.    [provider]    Family History No family history on file.  Social History Social History   Tobacco Use  . Smoking status: Never Smoker  . Smokeless tobacco: Never Used  Substance Use Topics  . Alcohol use: No  . Drug use: No     Allergies   Patient has no known allergies.   Review of Systems Review of Systems + sore throat + cough No pleuritic pain No wheezing + nasal congestion + post-nasal drainage No sinus pain/pressure No itchy/red eyes No earache No hemoptysis No SOB No  fever, + chills No nausea No vomiting No abdominal pain + diarrhea, resolved No urinary symptoms No skin rash + fatigue + myalgias No headache    Physical Exam Triage Vital Signs ED Triage Vitals  Enc Vitals Group     BP 09/08/17 1531 110/75     Pulse Rate 09/08/17 1531 (!) 106     Resp 09/08/17 1531 16     Temp 09/08/17 1531 (!) 100.6 F (38.1 C)     Temp Source 09/08/17 1531 Oral     SpO2 09/08/17 1531 100 %     Weight 09/08/17 1533 156 lb (70.8 kg)     Height 09/08/17 1533 5\' 6"  (1.676 m)     Head Circumference --      Peak Flow --      Pain Score 09/08/17 1533 9     Pain Loc --      Pain Edu? --      Excl. in GC? --    No data found.  Updated Vital Signs BP 110/75 (BP Location: Right Arm)   Pulse (!) 106   Temp (!) 100.6 F (38.1 C) (Oral)   Resp 16   Ht 5\' 6"  (1.676 m)   Wt 156 lb (70.8 kg)   LMP 08/10/2017 (Exact Date)  SpO2 100%   BMI 25.18 kg/m   Visual Acuity Right Eye Distance:   Left Eye Distance:   Bilateral Distance:    Right Eye Near:   Left Eye Near:    Bilateral Near:     Physical Exam Nursing notes and Vital Signs reviewed. Appearance:  Patient appears stated age, and in no acute distress Eyes:  Pupils are equal, round, and reactive to light and accomodation.  Extraocular movement is intact.  Conjunctivae are not inflamed  Ears:  Canals normal.  Tympanic membranes normal.  Nose:  Mildly congested turbinates.  No sinus tenderness. Pharynx:   Mildly erythematous. Neck:  Supple.  Enlarged posterior/lateral nodes are palpated bilaterally, tender to palpation on the left.   Lungs:  Clear to auscultation.  Breath sounds are equal.  Moving air well. Heart:  Regular rate and rhythm without murmurs, rubs, or gallops.  Abdomen:  Nontender without masses or hepatosplenomegaly.  Bowel sounds are present.  No CVA or flank tenderness.  Extremities:  No edema.  Skin:  No rash present.    UC Treatments / Results  Labs (all labs ordered are  listed, but only abnormal results are displayed) Labs Reviewed  POCT RAPID STREP A (OFFICE) negative    EKG  EKG Interpretation None       Radiology No results found.  Procedures Procedures (including critical care time)  Medications Ordered in UC Medications  ibuprofen (ADVIL,MOTRIN) tablet 600 mg (600 mg Oral Given 09/08/17 1538)     Initial Impression / Assessment and Plan / UC Course  I have reviewed the triage vital signs and the nursing notes.  Pertinent labs & imaging results that were available during my care of the patient were reviewed by me and considered in my medical decision making (see chart for details).    Begin Tamiflu. Take plain guaifenesin (1200mg  extended release tabs such as Mucinex) twice daily, with plenty of water, for cough and congestion.  May add Pseudoephedrine (30mg , one or two every 4 to 6 hours) for sinus congestion.  Get adequate rest.   May use Afrin nasal spray (or generic oxymetazoline) each morning for about 5 days and then discontinue.  Also recommend using saline nasal spray several times daily and saline nasal irrigation (AYR is a common brand).   Try warm salt water gargles for sore throat.  Stop all antihistamines for now, and other non-prescription cough/cold preparations. May take Ibuprofen 200mg , 4 tabs every 8 hours with food for fever, body aches, headache, etc. May take Delsym Cough Suppressant at bedtime for nighttime cough.  Followup with Family Doctor if not improved in one week.     Final Clinical Impressions(s) / UC Diagnoses   Final diagnoses:  Influenza-like illness    ED Discharge Orders        Ordered    oseltamivir (TAMIFLU) 75 MG capsule  Every 12 hours     09/08/17 1700          Lattie HawBeese, Pavan Bring A, MD 09/11/17 1127

## 2017-09-11 ENCOUNTER — Telehealth: Payer: Self-pay | Admitting: Emergency Medicine

## 2017-09-11 NOTE — Telephone Encounter (Signed)
Patient states that she is doing much better.  Will follow up as needed. 

## 2018-01-29 ENCOUNTER — Emergency Department (HOSPITAL_BASED_OUTPATIENT_CLINIC_OR_DEPARTMENT_OTHER)
Admission: EM | Admit: 2018-01-29 | Discharge: 2018-01-29 | Disposition: A | Discharge status: Home / Self Care | Payer: 59 | Attending: Emergency Medicine | Admitting: Emergency Medicine

## 2018-01-29 ENCOUNTER — Other Ambulatory Visit: Payer: Self-pay

## 2018-01-29 DIAGNOSIS — R070 Pain in throat: Secondary | ICD-10-CM | POA: Diagnosis present

## 2018-01-29 DIAGNOSIS — J029 Acute pharyngitis, unspecified: Secondary | ICD-10-CM | POA: Diagnosis not present

## 2018-01-29 DIAGNOSIS — Z79899 Other long term (current) drug therapy: Secondary | ICD-10-CM | POA: Diagnosis not present

## 2018-01-29 LAB — RAPID HIV SCREEN (HIV 1/2 AB+AG)
HIV 1/2 Antibodies: NONREACTIVE
HIV-1 P24 Antigen - HIV24: NONREACTIVE

## 2018-01-29 LAB — RAPID STREP SCREEN (MED CTR MEBANE ONLY): Streptococcus, Group A Screen (Direct): NEGATIVE

## 2018-01-29 MED ORDER — IBUPROFEN 600 MG PO TABS: 600.0000 mg | ORAL_TABLET | Freq: Four times a day (QID) | ORAL | 0 refills | Status: DC | PRN

## 2018-01-29 MED ORDER — LIDOCAINE HCL (PF) 1 % IJ SOLN
INTRAMUSCULAR | Status: AC
  Filled 2018-01-29: qty 5

## 2018-01-29 MED ORDER — CEFTRIAXONE SODIUM 250 MG IJ SOLR
250.0000 mg | Freq: Once | INTRAMUSCULAR | Status: AC
  Administered 2018-01-29: 250 mg via INTRAMUSCULAR
  Filled 2018-01-29: qty 250

## 2018-01-29 MED ORDER — AZITHROMYCIN 250 MG PO TABS
1000.0000 mg | ORAL_TABLET | Freq: Once | ORAL | Status: AC
Start: 2018-01-29 — End: 2018-01-29
  Administered 2018-01-29: 1000 mg via ORAL
  Filled 2018-01-29: qty 4

## 2018-01-29 NOTE — ED Triage Notes (Signed)
PResents with white patches to inside of soft palate and throat that began today associated with pain. Denies fevers.

## 2018-01-29 NOTE — ED Provider Notes (Signed)
[facility] EMERGENCY DEPARTMENT Provider Note   CSN: [id number] Arrival date & time: 01/29/18  0028     History   Chief Complaint Chief Complaint  Patient presents with  . Sore Throat    HPI Amber Olsen is a 37 y.o. female.  HPI 37 year old female comes in with chief complaint of sore throat. Patient woke up this morning and noted that she had sore throat.  Eventually when she looked at the back of her throat she noted multiple punctate white spots, and decided to come to the ER.  Patient denies any rash, including in her hands or feet.  She also denies any fevers, chills, URI-like symptoms or sick contacts.  Patient does admit to having unprotected intercourse with one individual, who she is not 100% sure is reliable.  No past medical history on file.  There are no active problems to display for this patient.   No past surgical history on file.   OB History    Gravida  4   Para  1   Term  1   Preterm  0   AB  2   Living  1     SAB  1   TAB  1   Ectopic  0   Multiple  0   Live Births               Home Medications    Prior to Admission medications   Medication Sig Start Date End Date Taking? Authorizing Provider  sertraline (ZOLOFT) 50 MG tablet Take by mouth. 12/23/17  Yes [provider]  topiramate (TOPAMAX) 25 MG tablet Take by mouth. 04/27/17  Yes [provider]  amoxicillin (AMOXIL) 500 MG capsule Take 1 capsule (500 mg total) by mouth 3 (three) times daily. 03/27/17   Elson AreasSofia, Leslie K, PA-C  ibuprofen (ADVIL,MOTRIN) 600 MG tablet Take 1 tablet (600 mg total) by mouth every 6 (six) hours as needed. 01/29/18   Derwood KaplanNanavati, Madigan Rosensteel, MD  oseltamivir (TAMIFLU) 75 MG capsule Take 1 capsule (75 mg total) by mouth every 12 (twelve) hours. 09/08/17   Lattie HawBeese, Stephen A, MD  SUMAtriptan Succinate (IMITREX PO) Take by mouth.    [provider]  Topiramate (TOPAMAX PO) Take by mouth.    [provider]     Family History No family history on file.  Social History Social History   Tobacco Use  . Smoking status: Never Smoker  . Smokeless tobacco: Never Used  Substance Use Topics  . Alcohol use: No  . Drug use: No     Allergies   Patient has no known allergies.   Review of Systems Review of Systems  Constitutional: Positive for activity change.  HENT: Positive for sore throat.   Respiratory: Negative for cough.   Allergic/Immunologic: Negative for immunocompromised state.     Physical Exam Updated Vital Signs BP (!) 140/98 (BP Location: Right Arm)   Pulse 76   Temp 98.4 F (36.9 C) (Oral)   Resp 19   Ht 5\' 6"  (1.676 m)   Wt 70.3 kg (155 lb)   SpO2 100%   BMI 25.02 kg/m   Physical Exam  Constitutional: She is oriented to person, place, and time. She appears well-developed.  HENT:  Head: Normocephalic and atraumatic.  Mouth/Throat: Mucous membranes are normal. Oropharyngeal exudate present. Tonsillar exudate.  Multiple exudative lesions that are punctate in nature over the uvula and tonsillar pillars.  Eyes: EOM are normal.  Neck: Normal range of motion. Neck  supple.  Cardiovascular: Normal rate.  Pulmonary/Chest: Effort normal.  Abdominal: Bowel sounds are normal.  Lymphadenopathy:    She has cervical adenopathy.  Neurological: She is alert and oriented to person, place, and time.  Skin: Skin is warm and dry.  Nursing note and vitals reviewed.    ED Treatments / Results  Labs (all labs ordered are listed, but only abnormal results are displayed) Labs Reviewed  RAPID STREP SCREEN ([facility] ONLY)  CULTURE, GROUP A STREP ([facility])  RAPID HIV SCREEN (HIV 1/2 AB+AG)    EKG None  Radiology No results found.  Procedures Procedures (including critical care time)  Medications Ordered in ED Medications  azithromycin (ZITHROMAX) tablet 1,000 mg (1,000 mg Oral Given 01/29/18 0452)  cefTRIAXone (ROCEPHIN) injection 250 mg (250 mg Intramuscular Given  01/29/18 0452)     Initial Impression / Assessment and Plan / ED Course  I have reviewed the triage vital signs and the nursing notes.  Pertinent labs & imaging results that were available during my care of the patient were reviewed by me and considered in my medical decision making (see chart for details).     37 year old female comes into the ER with chief complaint of sore throat. Rapid strep is negative. No evidence of herpangina, no rash, no lesions over the hand and foot.  Patient does admit to some high risk sexual behavior -we will cover for gonococcal infection.  HIV screen done.  Final Clinical Impressions(s) / ED Diagnoses   Final diagnoses:  Acute pharyngitis, unspecified etiology    ED Discharge Orders        Ordered    ibuprofen (ADVIL,MOTRIN) 600 MG tablet  Every 6 hours PRN     01/29/18 0438       Derwood Kaplan, MD 01/29/18 [phone]

## 2018-01-29 NOTE — Discharge Instructions (Signed)
We saw you in the ER for sore throat. Strep screen is negative.  As discussed, besides bacterial infection there could be other causes for sore throat.  We recommend that you follow-up with your primary care doctor if the symptoms continue.  Return to the ER immediately if you get to the point where you are unable to swallow or are having difficulty in breathing.

## 2018-01-31 LAB — CULTURE, GROUP A STREP (THRC)

## 2018-02-02 ENCOUNTER — Other Ambulatory Visit: Payer: Self-pay

## 2018-02-02 ENCOUNTER — Emergency Department (INDEPENDENT_AMBULATORY_CARE_PROVIDER_SITE_OTHER)
Admission: EM | Admit: 2018-02-02 | Discharge: 2018-02-02 | Disposition: A | Discharge status: Home / Self Care | Payer: 59 | Source: Home / Self Care | Attending: Family Medicine | Admitting: Family Medicine

## 2018-02-02 ENCOUNTER — Encounter: Payer: Self-pay | Admitting: *Deleted

## 2018-02-02 DIAGNOSIS — B370 Candidal stomatitis: Secondary | ICD-10-CM

## 2018-02-02 HISTORY — DX: Migraine, unspecified, not intractable, without status migrainosus: G43.909

## 2018-02-02 MED ORDER — FLUCONAZOLE 200 MG PO TABS: 200.0000 mg | ORAL_TABLET | Freq: Every day | ORAL | 0 refills | Status: DC

## 2018-02-02 NOTE — ED Triage Notes (Signed)
Pt c/o white tongue x 4 days. She went to the ED Sunday with sore throat given ABT injection.

## 2018-02-02 NOTE — Discharge Instructions (Signed)
°  Please take the medication as needed.  If you find that your mouth tends to stay dry, you may try over the counter Biotene to help. Please follow up with family medicine in 1 week if not improving.

## 2018-02-02 NOTE — ED Provider Notes (Signed)
[facility]    CSN: [id number] Arrival date & time: 02/02/18  1351     History   Chief Complaint Chief Complaint  Patient presents with  . Oral Pain    HPI Amber Olsen is a 37 y.o. female.   HPI Amber Olsen is a 37 y.o. female presenting to UC with c/o tongue pain with a white film that started with a sore throat this weekend. She was seen in the ED and tx for possible gonorrhea after a negative strep test.  Throat pain has improved but the tongue pain has not. She is concerned she has thrush. No prior hx of thrush. She notes her migraine medication does cause her mouth to stay dry. No hx of DM.   Past Medical History:  Diagnosis Date  . Migraines     There are no active problems to display for this patient.   History reviewed. No pertinent surgical history.  OB History    Gravida  4   Para  1   Term  1   Preterm  0   AB  2   Living  1     SAB  1   TAB  1   Ectopic  0   Multiple  0   Live Births               Home Medications    Prior to Admission medications   Medication Sig Start Date End Date Taking? Authorizing Provider  sertraline (ZOLOFT) 50 MG tablet Take by mouth. 12/23/17  Yes [provider]  topiramate (TOPAMAX) 25 MG tablet Take by mouth. 04/27/17  Yes [provider]  fluconazole (DIFLUCAN) 200 MG tablet Take 1 tablet (200 mg total) by mouth daily. 02/02/18   Lurene ShadowPhelps, [place] Montini O, PA-C  ibuprofen (ADVIL,MOTRIN) 600 MG tablet Take 1 tablet (600 mg total) by mouth every 6 (six) hours as needed. 01/29/18   Derwood KaplanNanavati, Ankit, MD  SUMAtriptan Succinate (IMITREX PO) Take by mouth.    [provider]  Topiramate (TOPAMAX PO) Take by mouth.    [provider]    Family History History reviewed. No pertinent family history.  Social History Social History   Tobacco Use  . Smoking status: Never Smoker  . Smokeless tobacco: Never Used  Substance Use Topics  . Alcohol use: No  . Drug  use: No     Allergies   Patient has no known allergies.   Review of Systems Review of Systems  Constitutional: Negative for chills and fever.  HENT: Negative for congestion, ear pain, sore throat, trouble swallowing and voice change.        Mouth pain/tongue pain  Respiratory: Negative for cough and shortness of breath.   Musculoskeletal: Negative for arthralgias.  Skin: Negative for rash.     Physical Exam Triage Vital Signs ED Triage Vitals [02/02/18 1405]  Enc Vitals Group     BP (!) 149/92     Pulse Rate 75     Resp 16     Temp 99.2 F (37.3 C)     Temp Source Oral     SpO2 100 %     Weight 150 lb (68 kg)     Height 5\' 6"  (1.676 m)     Head Circumference      Peak Flow      Pain Score 0     Pain Loc      Pain Edu?      Excl. in GC?  No data found.  Updated Vital Signs BP (!) 149/92 (BP Location: Right Arm)   Pulse 75   Temp 99.2 F (37.3 C) (Oral)   Resp 16   Ht 5\' 6"  (1.676 m)   Wt 150 lb (68 kg)   LMP 01/27/2018   SpO2 100%   BMI 24.21 kg/m   Visual Acuity Right Eye Distance:   Left Eye Distance:   Bilateral Distance:    Right Eye Near:   Left Eye Near:    Bilateral Near:     Physical Exam  Constitutional: She is oriented to person, place, and time. She appears well-developed and well-nourished. No distress.  HENT:  Head: Normocephalic and atraumatic.  Right Ear: Tympanic membrane normal.  Left Ear: Tympanic membrane normal.  Nose: Nose normal.  Mouth/Throat: Uvula is midline, oropharynx is clear and moist and mucous membranes are normal. Oral lesions present.  White film on tongue. Mildly tender. No ulcerations. No pharyngeal erythema, edema or exudate.   Eyes: EOM are normal.  Neck: Normal range of motion.  Cardiovascular: Normal rate and regular rhythm.  Pulmonary/Chest: Effort normal and breath sounds normal. No stridor. No respiratory distress. She has no wheezes.  Musculoskeletal: Normal range of motion.  Neurological: She is  alert and oriented to person, place, and time.  Skin: Skin is warm and dry. She is not diaphoretic.  Psychiatric: She has a normal mood and affect. Her behavior is normal.  Nursing note and vitals reviewed.    UC Treatments / Results  Labs (all labs ordered are listed, but only abnormal results are displayed) Labs Reviewed - No data to display  EKG None  Radiology No results found.  Procedures Procedures (including critical care time)  Medications Ordered in UC Medications - No data to display  Initial Impression / Assessment and Plan / UC Course  I have reviewed the triage vital signs and the nursing notes.  Pertinent labs & imaging results that were available during my care of the patient were reviewed by me and considered in my medical decision making (see chart for details).     Hx and exam c/w oral thrush. Home care instructions provided below.   Final Clinical Impressions(s) / UC Diagnoses   Final diagnoses:  Oral thrush     Discharge Instructions      Please take the medication as needed.  If you find that your mouth tends to stay dry, you may try over the counter Biotene to help. Please follow up with family medicine in 1 week if not improving.     ED Prescriptions    Medication Sig Dispense Auth. Provider   fluconazole (DIFLUCAN) 200 MG tablet Take 1 tablet (200 mg total) by mouth daily. 7 tablet Lurene Shadow, PA-C     Controlled Substance Prescriptions [place] Controlled Substance Registry consulted? Not Applicable   Rolla Plate 02/02/18 1424

## 2018-03-31 ENCOUNTER — Ambulatory Visit (HOSPITAL_COMMUNITY)
Admission: EM | Admit: 2018-03-31 | Discharge: 2018-03-31 | Disposition: A | Discharge status: Home / Self Care | Payer: 59 | Attending: Internal Medicine | Admitting: Internal Medicine

## 2018-03-31 ENCOUNTER — Encounter (HOSPITAL_COMMUNITY): Payer: Self-pay

## 2018-03-31 DIAGNOSIS — R030 Elevated blood-pressure reading, without diagnosis of hypertension: Secondary | ICD-10-CM | POA: Diagnosis not present

## 2018-03-31 DIAGNOSIS — Z113 Encounter for screening for infections with a predominantly sexual mode of transmission: Secondary | ICD-10-CM | POA: Diagnosis not present

## 2018-03-31 DIAGNOSIS — Z202 Contact with and (suspected) exposure to infections with a predominantly sexual mode of transmission: Secondary | ICD-10-CM

## 2018-03-31 DIAGNOSIS — G43909 Migraine, unspecified, not intractable, without status migrainosus: Secondary | ICD-10-CM | POA: Diagnosis not present

## 2018-03-31 DIAGNOSIS — Z79899 Other long term (current) drug therapy: Secondary | ICD-10-CM | POA: Diagnosis not present

## 2018-03-31 DIAGNOSIS — J029 Acute pharyngitis, unspecified: Secondary | ICD-10-CM

## 2018-03-31 MED ORDER — AZITHROMYCIN 250 MG PO TABS
ORAL_TABLET | ORAL | Status: AC
  Filled 2018-03-31: qty 4

## 2018-03-31 MED ORDER — CEFTRIAXONE SODIUM 250 MG IJ SOLR
250.0000 mg | Freq: Once | INTRAMUSCULAR | Status: AC
Start: 2018-03-31 — End: 2018-03-31
  Administered 2018-03-31: 250 mg via INTRAMUSCULAR

## 2018-03-31 MED ORDER — AZITHROMYCIN 250 MG PO TABS
1000.0000 mg | ORAL_TABLET | Freq: Once | ORAL | Status: AC
  Administered 2018-03-31: 1000 mg via ORAL

## 2018-03-31 MED ORDER — CEFTRIAXONE SODIUM 250 MG IJ SOLR
INTRAMUSCULAR | Status: AC
  Filled 2018-03-31: qty 250

## 2018-03-31 MED ORDER — LIDOCAINE HCL (PF) 1 % IJ SOLN
INTRAMUSCULAR | Status: AC
  Filled 2018-03-31: qty 2

## 2018-03-31 NOTE — Discharge Instructions (Addendum)
You were given a shot of Rocephin (antibiotic) and Zithromax(antibiotic) pills to treat Chlamydia and Gonorrhea today. Recommend no sexual contact for at least 7 days. Encouraged to used condoms with each and every future sexual encounter. Follow-up pending lab results.

## 2018-03-31 NOTE — ED Triage Notes (Signed)
Pt presents with complaints of possible exposure to std. Pt partner has been having penile discharge. His results are not back yet. Pt denies vaginal symptoms. Has been having a sore throat x 1 day and reports having oral sexual intercourse with partner.

## 2018-03-31 NOTE — ED Provider Notes (Signed)
[facility]    CSN: [id number] Arrival date & time: 03/31/18  1836     History   Chief Complaint Chief Complaint  Patient presents with  . Exposure to STD    HPI Amber Olsen is a 37 y.o. female.   38 year old female presents with possible exposure to STD. Her current partner has complained of penile discharge and was just tested- still waiting for results. She has had one partner in the past 3 months and occasionally uses condoms. She denies any fever, dysuria, unusual vaginal discharge, abdominal or pelvic pain. She does complain of a mild sore throat that started today and concerned about STD infection since she has performed oral sex on her partner. She denies any cough, congestion, nausea, vomiting or diarrhea. She has not taken any medications for sore throat. Other chronic health issues include migraines and she is currently on Topamax, Zoloft daily and Imitrex and Ibuprofen prn.   The history is provided by the patient.    Past Medical History:  Diagnosis Date  . Migraines     There are no active problems to display for this patient.   History reviewed. No pertinent surgical history.  OB History    Gravida  4   Para  1   Term  1   Preterm  0   AB  2   Living  1     SAB  1   TAB  1   Ectopic  0   Multiple  0   Live Births               Home Medications    Prior to Admission medications   Medication Sig Start Date End Date Taking? Authorizing Provider  ibuprofen (ADVIL,MOTRIN) 600 MG tablet Take 1 tablet (600 mg total) by mouth every 6 (six) hours as needed. 01/29/18  Yes Derwood Kaplan, MD  sertraline (ZOLOFT) 50 MG tablet Take by mouth. 12/23/17  Yes [provider]  SUMAtriptan Succinate (IMITREX PO) Take by mouth.   Yes [provider]  topiramate (TOPAMAX) 25 MG tablet Take by mouth. 04/27/17  Yes [provider]    Family History Family History  Problem Relation Age of Onset  . Healthy  Mother   . Healthy Father     Social History Social History   Tobacco Use  . Smoking status: Never Smoker  . Smokeless tobacco: Never Used  Substance Use Topics  . Alcohol use: No  . Drug use: No     Allergies   Patient has no known allergies.   Review of Systems Review of Systems  Constitutional: Negative for activity change, appetite change, chills, fatigue and fever.  HENT: Positive for sore throat. Negative for congestion, ear discharge, ear pain, mouth sores, postnasal drip, rhinorrhea, sinus pressure, sinus pain, sneezing and trouble swallowing.   Eyes: Negative for pain, discharge, redness and itching.  Respiratory: Negative for cough, chest tightness, shortness of breath and wheezing.   Gastrointestinal: Negative for abdominal pain, diarrhea, nausea and vomiting.  Genitourinary: Negative for decreased urine volume, difficulty urinating, dysuria, flank pain, frequency, genital sores, hematuria, menstrual problem, pelvic pain, urgency, vaginal bleeding, vaginal discharge and vaginal pain.  Musculoskeletal: Negative for arthralgias, back pain, myalgias and neck pain.  Skin: Negative for color change, rash and wound.  Allergic/Immunologic: Negative for immunocompromised state.  Neurological: Positive for headaches. Negative for dizziness, tremors, seizures, syncope, weakness, light-headedness and numbness.  Hematological: Negative for adenopathy. Does not bruise/bleed easily.  Physical Exam Triage Vital Signs ED Triage Vitals  Enc Vitals Group     BP 03/31/18 1901 (!) 151/100     Pulse Rate 03/31/18 1901 64     Resp 03/31/18 1901 18     Temp 03/31/18 1901 98.6 F (37 C)     Temp src --      SpO2 03/31/18 1901 100 %     Weight --      Height --      Head Circumference --      Peak Flow --      Pain Score 03/31/18 1902 3     Pain Loc --      Pain Edu? --      Excl. in GC? --    No data found.  Updated Vital Signs BP (!) 151/100   Pulse 64   Temp 98.6  F (37 C)   Resp 18   LMP 03/26/2018   SpO2 100%   Visual Acuity Right Eye Distance:   Left Eye Distance:   Bilateral Distance:    Right Eye Near:   Left Eye Near:    Bilateral Near:     Physical Exam  Constitutional: She is oriented to person, place, and time. She appears well-developed and well-nourished. She is cooperative. She does not appear ill. No distress.  Patient is sitting quietly on exam table in no acute distress.   HENT:  Head: Normocephalic and atraumatic.  Right Ear: Hearing, tympanic membrane, external ear and ear canal normal.  Left Ear: Hearing, tympanic membrane, external ear and ear canal normal.  Nose: Nose normal. Right sinus exhibits no maxillary sinus tenderness and no frontal sinus tenderness. Left sinus exhibits no maxillary sinus tenderness and no frontal sinus tenderness.  Mouth/Throat: Uvula is midline and mucous membranes are normal. Posterior oropharyngeal erythema present. No oropharyngeal exudate.  Eyes: Conjunctivae and EOM are normal.  Neck: Normal range of motion. Neck supple.  Cardiovascular: Normal rate, regular rhythm and normal heart sounds.  No murmur heard. Pulmonary/Chest: Effort normal and breath sounds normal. No respiratory distress. She has no decreased breath sounds. She has no wheezes. She has no rhonchi.  Abdominal: Soft. Normal appearance and bowel sounds are normal. She exhibits no distension. There is no tenderness. There is no rigidity, no rebound, no guarding and no CVA tenderness.  Genitourinary: Uterus normal. Pelvic exam was performed with patient in the knee-chest position. There is no rash, tenderness, lesion or injury on the right labia. There is no rash, tenderness, lesion or injury on the left labia. Cervix exhibits discharge. Cervix exhibits no motion tenderness and no friability. Right adnexum displays no mass and no tenderness. Left adnexum displays no mass and no tenderness. No erythema, tenderness or bleeding in the  vagina. Vaginal discharge (yellowish-brown) found.  Genitourinary Comments: Slight end of period darker brown discharge with yellowish discharge present deep in vaginal vault.   Musculoskeletal: Normal range of motion.  Lymphadenopathy:    She has no cervical adenopathy. No inguinal adenopathy noted on the right or left side.  Neurological: She is alert and oriented to person, place, and time.  Skin: Skin is warm and dry. No rash noted.  Psychiatric: Her speech is normal and behavior is normal. Thought content normal. Her affect is blunt (more flat affect). Cognition and memory are normal.  Vitals reviewed.    UC Treatments / Results  Labs (all labs ordered are listed, but only abnormal results are displayed) Labs Reviewed  CERVICOVAGINAL ANCILLARY ONLY  EKG None  Radiology No results found.  Procedures Procedures (including critical care time)  Medications Ordered in UC Medications  azithromycin (ZITHROMAX) tablet 1,000 mg (1,000 mg Oral Given 03/31/18 1950)  cefTRIAXone (ROCEPHIN) injection 250 mg (250 mg Intramuscular Given 03/31/18 1951)    Initial Impression / Assessment and Plan / UC Course  I have reviewed the triage vital signs and the nursing notes.  Pertinent labs & imaging results that were available during my care of the patient were reviewed by me and considered in my medical decision making (see chart for details).    Discussed with patient that she may have Chlamydia, Gonorrhea or Trich. Will treat today for most common STD's- gave Zithromax 1g orally and Rocephin 250mg  IM. Will wait on giving Flagyl pending lab results. Discussed that treatment today will take care of sore throat caused by Gonorrhea or Chlamydia. She may have a viral sore throat which may last 5 to 7 days. If any fever, swollen glands or additional symptoms develop, return for recheck. Also discussed monitoring blood pressure- may be elevated today due to situation. Patient indicates that blood  pressure sometimes is elevated in the office. Recommend continue to monitor BP.  No sexual intercourse for at least 7 days. Encouraged to use condoms with each and every sexual encounter. Follow-up pending lab results.  Final Clinical Impressions(s) / UC Diagnoses   Final diagnoses:  Potential exposure to STD  Sore throat  Elevated blood-pressure reading without diagnosis of hypertension     Discharge Instructions     You were given a shot of Rocephin (antibiotic) and Zithromax(antibiotic) pills to treat Chlamydia and Gonorrhea today. Recommend no sexual contact for at least 7 days. Encouraged to used condoms with each and every future sexual encounter. Follow-up pending lab results.     ED Prescriptions    None     Controlled Substance Prescriptions [place] Controlled Substance Registry consulted? Not Applicable   Sudie Grumblingmyot, Azani Brogdon Berry, NP 04/01/18 [phone]

## 2018-04-04 LAB — CERVICOVAGINAL ANCILLARY ONLY
Bacterial vaginitis: NEGATIVE
Candida vaginitis: NEGATIVE
Chlamydia: NEGATIVE
Neisseria Gonorrhea: NEGATIVE
Trichomonas: NEGATIVE

## 2018-07-20 ENCOUNTER — Emergency Department (INDEPENDENT_AMBULATORY_CARE_PROVIDER_SITE_OTHER)
Admission: EM | Admit: 2018-07-20 | Discharge: 2018-07-20 | Disposition: A | Discharge status: Home / Self Care | Payer: 59 | Source: Home / Self Care | Attending: Family Medicine | Admitting: Family Medicine

## 2018-07-20 ENCOUNTER — Other Ambulatory Visit: Payer: Self-pay

## 2018-07-20 ENCOUNTER — Encounter: Payer: Self-pay | Admitting: Emergency Medicine

## 2018-07-20 DIAGNOSIS — J029 Acute pharyngitis, unspecified: Secondary | ICD-10-CM | POA: Diagnosis not present

## 2018-07-20 DIAGNOSIS — B9789 Other viral agents as the cause of diseases classified elsewhere: Secondary | ICD-10-CM

## 2018-07-20 DIAGNOSIS — J069 Acute upper respiratory infection, unspecified: Secondary | ICD-10-CM

## 2018-07-20 LAB — POCT RAPID STREP A (OFFICE): Rapid Strep A Screen: NEGATIVE

## 2018-07-20 MED ORDER — BENZONATATE 100 MG PO CAPS: 100.0000 mg | ORAL_CAPSULE | Freq: Three times a day (TID) | ORAL | 0 refills | Status: DC

## 2018-07-20 NOTE — ED Triage Notes (Signed)
Sore throat, fever x 3 days, worse today.

## 2018-07-20 NOTE — ED Provider Notes (Signed)
[facility]    CSN: [id number] Arrival date & time: 07/20/18  1739     History   Chief Complaint Chief Complaint  Patient presents with  . Sore Throat    HPI Amber Olsen is a 37 y.o. female.   HPI  Amber Olsen is a 37 y.o. female presenting to UC with c/o sore throat with low grade fever for 3 days. Mild cough with congestion and ear fullness. Her daughter has been sick with a viral illness recently. She has taken OTC medications w/o relief. She has been able to eat and drink. No trouble breathing.    Past Medical History:  Diagnosis Date  . Migraines     There are no active problems to display for this patient.   History reviewed. No pertinent surgical history.  OB History    Gravida  4   Para  1   Term  1   Preterm  0   AB  2   Living  1     SAB  1   TAB  1   Ectopic  0   Multiple  0   Live Births               Home Medications    Prior to Admission medications   Medication Sig Start Date End Date Taking? Authorizing Provider  benzonatate (TESSALON) 100 MG capsule Take 1-2 capsules (100-200 mg total) by mouth every 8 (eight) hours. 07/20/18   Lurene ShadowPhelps, Leondra Cullin O, PA-C  ibuprofen (ADVIL,MOTRIN) 600 MG tablet Take 1 tablet (600 mg total) by mouth every 6 (six) hours as needed. 01/29/18   Derwood KaplanNanavati, Ankit, MD  sertraline (ZOLOFT) 50 MG tablet Take by mouth. 12/23/17   [provider]  SUMAtriptan Succinate (IMITREX PO) Take by mouth.    [provider]  topiramate (TOPAMAX) 25 MG tablet Take by mouth. 04/27/17   [provider]    Family History Family History  Problem Relation Age of Onset  . Healthy Mother   . Healthy Father     Social History Social History   Tobacco Use  . Smoking status: Never Smoker  . Smokeless tobacco: Never Used  Substance Use Topics  . Alcohol use: No  . Drug use: No     Allergies   Patient has no known allergies.   Review of Systems Review of Systems    Constitutional: Positive for fever. Negative for chills.  HENT: Positive for congestion, ear pain and sore throat.   Respiratory: Positive for cough.   Gastrointestinal: Negative for diarrhea, nausea and vomiting.     Physical Exam Triage Vital Signs ED Triage Vitals  Enc Vitals Group     BP 07/20/18 1830 (!) 138/92     Pulse Rate 07/20/18 1830 86     Resp --      Temp 07/20/18 1830 98.3 F (36.8 C)     Temp Source 07/20/18 1830 Oral     SpO2 07/20/18 1830 100 %     Weight --      Height --      Head Circumference --      Peak Flow --      Pain Score 07/20/18 1832 5     Pain Loc --      Pain Edu? --      Excl. in GC? --    No data found.  Updated Vital Signs BP (!) 138/92 (BP Location: Right Arm)   Pulse 86   Temp  98.3 F (36.8 C) (Oral)   SpO2 100%   Visual Acuity Right Eye Distance:   Left Eye Distance:   Bilateral Distance:    Right Eye Near:   Left Eye Near:    Bilateral Near:     Physical Exam Vitals signs and nursing note reviewed.  Constitutional:      Appearance: She is well-developed.  HENT:     Head: Normocephalic and atraumatic.     Right Ear: Tympanic membrane normal.     Left Ear: Tympanic membrane normal.     Nose: Nose normal.     Right Sinus: No maxillary sinus tenderness or frontal sinus tenderness.     Left Sinus: No maxillary sinus tenderness or frontal sinus tenderness.     Mouth/Throat:     Lips: Pink.     Pharynx: Oropharynx is clear. Posterior oropharyngeal erythema present. No pharyngeal swelling, oropharyngeal exudate or uvula swelling.  Neck:     Musculoskeletal: Normal range of motion.  Cardiovascular:     Rate and Rhythm: Normal rate and regular rhythm.  Pulmonary:     Effort: Pulmonary effort is normal. No respiratory distress.     Breath sounds: Normal breath sounds. No stridor. No wheezing, rhonchi or rales.  Musculoskeletal: Normal range of motion.  Skin:    General: Skin is warm and dry.  Neurological:     Mental  Status: She is alert and oriented to person, place, and time.  Psychiatric:        Behavior: Behavior normal.      UC Treatments / Results  Labs (all labs ordered are listed, but only abnormal results are displayed) Labs Reviewed  STREP A DNA PROBE  POCT RAPID STREP A (OFFICE)    EKG None  Radiology No results found.  Procedures Procedures (including critical care time)  Medications Ordered in UC Medications - No data to display  Initial Impression / Assessment and Plan / UC Course  I have reviewed the triage vital signs and the nursing notes.  Pertinent labs & imaging results that were available during my care of the patient were reviewed by me and considered in my medical decision making (see chart for details).     Rapid strep: NEGATIVE Culture sent Encouraged symptomatic tx  Final Clinical Impressions(s) / UC Diagnoses   Final diagnoses:  Acute pharyngitis, unspecified etiology  Viral URI with cough     Discharge Instructions      You may take 500mg  acetaminophen every 4-6 hours or in combination with ibuprofen 400-600mg  every 6-8 hours as needed for pain, inflammation, and fever.  Be sure to well hydrated with clear liquids and get at least 8 hours of sleep at night, preferably more while sick.   Please follow up with family medicine in 1 week if needed.     ED Prescriptions    Medication Sig Dispense Auth. Provider   benzonatate (TESSALON) 100 MG capsule Take 1-2 capsules (100-200 mg total) by mouth every 8 (eight) hours. 21 capsule Lurene ShadowPhelps, James Lafalce O, PA-C     Controlled Substance Prescriptions [place] Controlled Substance Registry consulted? Not Applicable   Rolla Platehelps, Karia Ehresman O, PA-C 07/21/18 [id number]

## 2018-07-20 NOTE — Discharge Instructions (Signed)
  You may take 500mg acetaminophen every 4-6 hours or in combination with ibuprofen 400-600mg every 6-8 hours as needed for pain, inflammation, and fever.  Be sure to well hydrated with clear liquids and get at least 8 hours of sleep at night, preferably more while sick.   Please follow up with family medicine in 1 week if needed.   

## 2018-07-21 ENCOUNTER — Telehealth: Payer: Self-pay | Admitting: Emergency Medicine

## 2018-07-21 LAB — STREP A DNA PROBE: Group A Strep Probe: NOT DETECTED

## 2018-07-21 NOTE — Telephone Encounter (Signed)
Strep culture neg

## 2018-08-12 ENCOUNTER — Other Ambulatory Visit (HOSPITAL_COMMUNITY)
Admission: RE | Admit: 2018-08-12 | Discharge: 2018-08-12 | Disposition: A | Discharge status: Home / Self Care | Payer: 59 | Source: Ambulatory Visit | Attending: Family Medicine | Admitting: Family Medicine

## 2018-08-12 ENCOUNTER — Encounter: Payer: Self-pay | Admitting: Emergency Medicine

## 2018-08-12 ENCOUNTER — Emergency Department (INDEPENDENT_AMBULATORY_CARE_PROVIDER_SITE_OTHER)
Admission: EM | Admit: 2018-08-12 | Discharge: 2018-08-12 | Disposition: A | Discharge status: Home / Self Care | Payer: 59 | Source: Home / Self Care | Attending: Family Medicine | Admitting: Family Medicine

## 2018-08-12 ENCOUNTER — Other Ambulatory Visit: Payer: Self-pay

## 2018-08-12 DIAGNOSIS — Z711 Person with feared health complaint in whom no diagnosis is made: Secondary | ICD-10-CM | POA: Insufficient documentation

## 2018-08-12 DIAGNOSIS — N898 Other specified noninflammatory disorders of vagina: Secondary | ICD-10-CM | POA: Insufficient documentation

## 2018-08-12 NOTE — ED Triage Notes (Signed)
Here with vaginal itching, clear d/c x3 days. Denies odor or pain. Requesting STI check/HIV/RPR. LMP 07/13/18.

## 2018-08-12 NOTE — Discharge Instructions (Signed)
°  Your test results should come back in about 2-3 days. You will be notified of the results and any medication that may be indicated will be called into your preferred pharmacy.

## 2018-08-12 NOTE — ED Provider Notes (Signed)
[facility]    CSN: [id number] Arrival date & time: 08/12/18  9937     History   Chief Complaint Chief Complaint  Patient presents with  . Vaginal Itching  . SEXUALLY TRANSMITTED DISEASE    HPI Amber Olsen is a 38 y.o. female.   HPI Amber Olsen is a 39 y.o. female presenting to UC with c/o vaginal itching and discharge for 3 days.  Symptoms started after having unprotected intercourse with a new partner. No specific known exposure. Shed like to be tested for all STIs.  LMP: 07/13/18. Denies concern for pregnancy. She is on birth control.   Past Medical History:  Diagnosis Date  . Migraines     There are no active problems to display for this patient.   History reviewed. No pertinent surgical history.  OB History    Gravida  4   Para  1   Term  1   Preterm  0   AB  2   Living  1     SAB  1   TAB  1   Ectopic  0   Multiple  0   Live Births               Home Medications    Prior to Admission medications   Medication Sig Start Date End Date Taking? Authorizing Provider  benzonatate (TESSALON) 100 MG capsule Take 1-2 capsules (100-200 mg total) by mouth every 8 (eight) hours. 07/20/18   Lurene Shadow, PA-C  ibuprofen (ADVIL,MOTRIN) 600 MG tablet Take 1 tablet (600 mg total) by mouth every 6 (six) hours as needed. 01/29/18   Derwood Kaplan, MD  sertraline (ZOLOFT) 50 MG tablet Take by mouth. 12/23/17   [provider]  SUMAtriptan Succinate (IMITREX PO) Take by mouth.    [provider]  topiramate (TOPAMAX) 25 MG tablet Take by mouth. 04/27/17   [provider]    Family History Family History  Problem Relation Age of Onset  . Healthy Mother   . Healthy Father     Social History Social History   Tobacco Use  . Smoking status: Never Smoker  . Smokeless tobacco: Never Used  Substance Use Topics  . Alcohol use: No  . Drug use: No     Allergies   Patient has no known  allergies.   Review of Systems Review of Systems  Gastrointestinal: Negative for abdominal pain, nausea and vomiting.  Genitourinary: Positive for vaginal discharge. Negative for dysuria, frequency, menstrual problem, vaginal bleeding and vaginal pain.  Musculoskeletal: Negative for back pain.  Skin: Negative for rash.     Physical Exam Triage Vital Signs ED Triage Vitals  Enc Vitals Group     BP 08/12/18 1017 130/88     Pulse Rate 08/12/18 1017 73     Resp --      Temp 08/12/18 1017 98.1 F (36.7 C)     Temp Source 08/12/18 1017 Oral     SpO2 08/12/18 1017 100 %     Weight 08/12/18 1018 157 lb 6.4 oz (71.4 kg)     Height 08/12/18 1018 5\' 6"  (1.676 m)     Head Circumference --      Peak Flow --      Pain Score 08/12/18 1018 0     Pain Loc --      Pain Edu? --      Excl. in GC? --    No data found.  Updated Vital Signs BP  130/88 (BP Location: Right Arm)   Pulse 73   Temp 98.1 F (36.7 C) (Oral)   Ht 5\' 6"  (1.676 m)   Wt 157 lb 6.4 oz (71.4 kg)   LMP 07/13/2018 (Exact Date)   SpO2 100%   BMI 25.41 kg/m   Visual Acuity Right Eye Distance:   Left Eye Distance:   Bilateral Distance:    Right Eye Near:   Left Eye Near:    Bilateral Near:     Physical Exam Vitals signs and nursing note reviewed. Exam conducted with a chaperone present.  Constitutional:      Appearance: Normal appearance. She is well-developed.  HENT:     Head: Normocephalic and atraumatic.  Neck:     Musculoskeletal: Normal range of motion.  Cardiovascular:     Rate and Rhythm: Normal rate and regular rhythm.  Pulmonary:     Effort: Pulmonary effort is normal.     Breath sounds: Normal breath sounds.  Genitourinary:    Vagina: Vaginal discharge ( moderate amount malodorous white creamy discharge) present.     Cervix: Normal.     Adnexa:        Right: No mass or tenderness.         Left: No mass or tenderness.    Musculoskeletal: Normal range of motion.  Skin:    General: Skin is  warm and dry.  Neurological:     Mental Status: She is alert and oriented to person, place, and time.  Psychiatric:        Behavior: Behavior normal.      UC Treatments / Results  Labs (all labs ordered are listed, but only abnormal results are displayed) Labs Reviewed  HIV ANTIBODY (ROUTINE TESTING W REFLEX)  RPR  CERVICOVAGINAL ANCILLARY ONLY  CERVICOVAGINAL ANCILLARY ONLY  CERVICOVAGINAL ANCILLARY ONLY    EKG None  Radiology No results found.  Procedures Procedures (including critical care time)  Medications Ordered in UC Medications - No data to display  Initial Impression / Assessment and Plan / UC Course  I have reviewed the triage vital signs and the nursing notes.  Pertinent labs & imaging results that were available during my care of the patient were reviewed by me and considered in my medical decision making (see chart for details).     Vaginal discharge Concern for STI Vaginal swabs and blood sent to lab  Final Clinical Impressions(s) / UC Diagnoses   Final diagnoses:  Vaginal discharge  Concern about STD in female without diagnosis     Discharge Instructions      Your test results should come back in about 2-3 days. You will be notified of the results and any medication that may be indicated will be called into your preferred pharmacy.    ED Prescriptions    None     Controlled Substance Prescriptions [place] Controlled Substance Registry consulted? Not Applicable   Rolla Plate 08/12/18 1146

## 2018-08-14 LAB — RPR: RPR Ser Ql: NONREACTIVE

## 2018-08-14 LAB — HIV ANTIBODY (ROUTINE TESTING W REFLEX): HIV 1&2 Ab, 4th Generation: NONREACTIVE

## 2018-08-15 LAB — CERVICOVAGINAL ANCILLARY ONLY
Bacterial vaginitis: NEGATIVE
Candida vaginitis: POSITIVE — AB
Chlamydia: NEGATIVE
Neisseria Gonorrhea: NEGATIVE
Trichomonas: NEGATIVE

## 2018-08-16 ENCOUNTER — Telehealth: Payer: Self-pay | Admitting: Emergency Medicine

## 2018-08-16 NOTE — Telephone Encounter (Signed)
Diflucan 200 1 PO for vaginal itching Repeatin 3 days if needed

## 2018-10-21 ENCOUNTER — Emergency Department (HOSPITAL_BASED_OUTPATIENT_CLINIC_OR_DEPARTMENT_OTHER): Payer: 59

## 2018-10-21 ENCOUNTER — Encounter (HOSPITAL_BASED_OUTPATIENT_CLINIC_OR_DEPARTMENT_OTHER): Payer: Self-pay | Admitting: *Deleted

## 2018-10-21 ENCOUNTER — Other Ambulatory Visit: Payer: Self-pay

## 2018-10-21 ENCOUNTER — Emergency Department (HOSPITAL_BASED_OUTPATIENT_CLINIC_OR_DEPARTMENT_OTHER)
Admission: EM | Admit: 2018-10-21 | Discharge: 2018-10-21 | Disposition: A | Discharge status: Home / Self Care | Payer: 59 | Attending: Emergency Medicine | Admitting: Emergency Medicine

## 2018-10-21 DIAGNOSIS — Z79899 Other long term (current) drug therapy: Secondary | ICD-10-CM | POA: Insufficient documentation

## 2018-10-21 DIAGNOSIS — R0602 Shortness of breath: Secondary | ICD-10-CM | POA: Insufficient documentation

## 2018-10-21 DIAGNOSIS — D509 Iron deficiency anemia, unspecified: Secondary | ICD-10-CM

## 2018-10-21 DIAGNOSIS — R0789 Other chest pain: Secondary | ICD-10-CM | POA: Insufficient documentation

## 2018-10-21 LAB — CBC
HCT: 35 % — ABNORMAL LOW (ref 36.0–46.0)
Hemoglobin: 10.7 g/dL — ABNORMAL LOW (ref 12.0–15.0)
MCH: 23.5 pg — AB (ref 26.0–34.0)
MCHC: 30.6 g/dL (ref 30.0–36.0)
MCV: 76.8 fL — ABNORMAL LOW (ref 80.0–100.0)
PLATELETS: 232 10*3/uL (ref 150–400)
RBC: 4.56 MIL/uL (ref 3.87–5.11)
RDW: 14.2 % (ref 11.5–15.5)
WBC: 6.5 10*3/uL (ref 4.0–10.5)
nRBC: 0 % (ref 0.0–0.2)

## 2018-10-21 LAB — BASIC METABOLIC PANEL
Anion gap: 5 (ref 5–15)
BUN: 9 mg/dL (ref 6–20)
CALCIUM: 8.8 mg/dL — AB (ref 8.9–10.3)
CO2: 23 mmol/L (ref 22–32)
CREATININE: 0.67 mg/dL (ref 0.44–1.00)
Chloride: 106 mmol/L (ref 98–111)
GFR calc Af Amer: >60 mL/min (ref >60)
GLUCOSE: 99 mg/dL (ref 70–99)
POTASSIUM: 3.6 mmol/L (ref 3.5–5.1)
Sodium: 134 mmol/L — ABNORMAL LOW (ref 135–145)

## 2018-10-21 LAB — D-DIMER, QUANTITATIVE: D-Dimer, Quant: 0.55 ug/mL-FEU — ABNORMAL HIGH (ref 0.00–0.50)

## 2018-10-21 LAB — PREGNANCY, URINE: Preg Test, Ur: NEGATIVE

## 2018-10-21 LAB — TROPONIN I: Troponin I: <0.03 ng/mL (ref <0.03)

## 2018-10-21 MED ORDER — IOPAMIDOL (ISOVUE-370) INJECTION 76%
100.0000 mL | Freq: Once | INTRAVENOUS | Status: AC | PRN
  Administered 2018-10-21: 87 mL via INTRAVENOUS

## 2018-10-21 NOTE — ED Provider Notes (Signed)
[facility] EMERGENCY DEPARTMENT Provider Note   CSN: [id number] Arrival date & time: 10/21/18  2024    History   Chief Complaint Chief Complaint  Patient presents with  . Shortness of Breath  . Chest Pain    HPI Amber Olsen is a 38 y.o. female.     The history is provided by the patient. No language interpreter was used.  Shortness of Breath  Associated symptoms: chest pain   Chest Pain  Associated symptoms: shortness of breath    Amber Olsen is a 38 y.o. female who presents to the Emergency Department complaining of CP. Presents to the emergency department complaining of sharp chest pain that began yesterday. Pain is located around the left chest and is waxing and waning in nature. It is worse with deep breaths and she has mild associated shortness of breath. She denies any fevers, nausea, cough, abdominal pain, leg swelling or pain. She has experienced in her symptoms in the past but did not seek evaluation at this times. She does take birth control. She does not smoke or do drugs. No recent surgeries. No significant past family medical history. Symptoms are moderate and waxing and waning. Past Medical History:  Diagnosis Date  . Migraines     There are no active problems to display for this patient.   History reviewed. No pertinent surgical history.   OB History    Gravida  4   Para  1   Term  1   Preterm  0   AB  2   Living  1     SAB  1   TAB  1   Ectopic  0   Multiple  0   Live Births               Home Medications    Prior to Admission medications   Medication Sig Start Date End Date Taking? Authorizing Provider  benzonatate (TESSALON) 100 MG capsule Take 1-2 capsules (100-200 mg total) by mouth every 8 (eight) hours. 07/20/18   Lurene Shadow, PA-C  ibuprofen (ADVIL,MOTRIN) 600 MG tablet Take 1 tablet (600 mg total) by mouth every 6 (six) hours as needed. 01/29/18   Derwood Kaplan, MD  sertraline (ZOLOFT) 50 MG  tablet Take by mouth. 12/23/17   [provider]  SUMAtriptan Succinate (IMITREX PO) Take by mouth.    [provider]  topiramate (TOPAMAX) 25 MG tablet Take by mouth. 04/27/17   [provider]    Family History Family History  Problem Relation Age of Onset  . Healthy Mother   . Healthy Father     Social History Social History   Tobacco Use  . Smoking status: Never Smoker  . Smokeless tobacco: Never Used  Substance Use Topics  . Alcohol use: No  . Drug use: No     Allergies   Patient has no known allergies.   Review of Systems Review of Systems  Respiratory: Positive for shortness of breath.   Cardiovascular: Positive for chest pain.  All other systems reviewed and are negative.    Physical Exam Updated Vital Signs BP 130/84   Pulse 69   Temp 98.6 F (37 C) (Oral)   Resp 15   Ht 5\' 6"  (1.676 m)   Wt 72.6 kg   LMP 09/29/2018   SpO2 100%   BMI 25.82 kg/m   Physical Exam Vitals signs and nursing note reviewed.  Constitutional:      Appearance: She is well-developed.  HENT:     Head: Normocephalic and atraumatic.  Cardiovascular:     Rate and Rhythm: Normal rate and regular rhythm.     Heart sounds: No murmur.  Pulmonary:     Effort: Pulmonary effort is normal. No respiratory distress.     Breath sounds: Normal breath sounds.  Chest:     Chest wall: No tenderness.  Abdominal:     Palpations: Abdomen is soft.     Tenderness: There is no abdominal tenderness. There is no guarding or rebound.  Musculoskeletal:        General: No swelling or tenderness.  Skin:    General: Skin is warm and dry.  Neurological:     Mental Status: She is alert and oriented to person, place, and time.  Psychiatric:        Behavior: Behavior normal.      ED Treatments / Results  Labs (all labs ordered are listed, but only abnormal results are displayed) Labs Reviewed  BASIC METABOLIC PANEL - Abnormal; Notable for the following components:       Result Value   Sodium 134 (*)    Calcium 8.8 (*)    All other components within normal limits  CBC - Abnormal; Notable for the following components:   Hemoglobin 10.7 (*)    HCT 35.0 (*)    MCV 76.8 (*)    MCH 23.5 (*)    All other components within normal limits  D-DIMER, QUANTITATIVE (NOT AT [facility]) - Abnormal; Notable for the following components:   D-Dimer, Quant 0.55 (*)    All other components within normal limits  TROPONIN I  PREGNANCY, URINE    EKG None  Radiology Dg Chest 2 View  Result Date: 10/21/2018 CLINICAL DATA:  Chest pain for 2 days. EXAM: CHEST - 2 VIEW COMPARISON:  09/13/2016 FINDINGS: The lungs are clear without focal pneumonia, edema, pneumothorax or pleural effusion. The cardiopericardial silhouette is within normal limits for size. Nodular density over the right lower lung is compatible with nipple shadow. The visualized bony structures of the thorax are intact. Telemetry leads overlie the chest. IMPRESSION: No active cardiopulmonary disease. Nodular density overlying the right lower lung is almost assuredly a nipple shadow. Repeat frontal radiograph nipple markers recommended to confirm. Electronically Signed   By: Kennith Center M.D.   On: 10/21/2018 21:24   Ct Angio Chest Pe W/cm &/or Wo Cm  Result Date: 10/21/2018 CLINICAL DATA:  Chest pain since last night, elevated D-dimer, shortness of breath, intermediate clinical probability for pulmonary embolism EXAM: CT ANGIOGRAPHY CHEST WITH CONTRAST TECHNIQUE: Multidetector CT imaging of the chest was performed using the standard protocol during bolus administration of intravenous contrast. Multiplanar CT image reconstructions and MIPs were obtained to evaluate the vascular anatomy. CONTRAST:  40mL ISOVUE-370 IOPAMIDOL (ISOVUE-370) INJECTION 76% IV COMPARISON:  Chest radiograph 10/21/2018 FINDINGS: Cardiovascular: Aorta normal caliber without aneurysm or dissection. Pulmonary arteries adequately opacified and patent.  No evidence of pulmonary embolism. Heart unremarkable. No pericardial effusion. Mediastinum/Nodes: Esophagus normal appearance. Base of cervical region normal appearance. No thoracic adenopathy. Minimal stranding in anterior mediastinum likely reflects minimal residual thymic tissue. Normal sized axillary lymph nodes bilaterally. Lungs/Pleura: Lungs clear. No pulmonary infiltrate, pleural effusion or pneumothorax. Upper Abdomen: Visualized upper abdomen unremarkable Musculoskeletal: Osseous structures unremarkable. Review of the MIP images confirms the above findings. IMPRESSION: Normal CTA chest. No evidence of pulmonary embolism. Electronically Signed   By: Ulyses Southward M.D.   On: 10/21/2018 22:17    Procedures Procedures (  including critical care time)  Medications Ordered in ED Medications  iopamidol (ISOVUE-370) 76 % injection 100 mL (87 mLs Intravenous Contrast Given 10/21/18 2159)     Initial Impression / Assessment and Plan / ED Course  I have reviewed the triage vital signs and the nursing notes.  Pertinent labs & imaging results that were available during my care of the patient were reviewed by me and considered in my medical decision making (see chart for details).       Pt here for evaluation of chest pain that is been waxing and waning since yesterday. EKG without acute ischemic changes. She is on oral contraceptives and as he dimer was obtained. D dimer is mildly elevated and CTA was obtained. CT is negative for PE. Current presentation is not consistent with ACS, pneumonia, dissection. Discussed with patient home care for chest pain. Labs demonstrate anemia, discussed with patient findings of labs, recommend PCP follow-up, starting over-the-counter multivitamin with iron. Return precautions discussed. Final Clinical Impressions(s) / ED Diagnoses   Final diagnoses:  Atypical chest pain  Microcytic anemia    ED Discharge Orders    None       Tilden Fossa, MD 10/21/18  2227

## 2018-10-21 NOTE — ED Triage Notes (Signed)
Pt reports sharp chest pain and SOB since yesterday. Denies cough and fever, denies recent travel

## 2018-10-21 NOTE — Discharge Instructions (Addendum)
You can take ibuprofen, available over the counter according to label instructions as needed for chest pain.

## 2018-10-21 NOTE — ED Notes (Addendum)
PT states understanding of care given, follow up care, and medication prescribed. PT ambulated from ED to car with a steady gait. 

## 2018-12-28 ENCOUNTER — Emergency Department (INDEPENDENT_AMBULATORY_CARE_PROVIDER_SITE_OTHER)
Admission: EM | Admit: 2018-12-28 | Discharge: 2018-12-28 | Disposition: A | Discharge status: Home / Self Care | Payer: 59 | Source: Home / Self Care | Attending: Family Medicine | Admitting: Family Medicine

## 2018-12-28 ENCOUNTER — Encounter: Payer: Self-pay | Admitting: Emergency Medicine

## 2018-12-28 ENCOUNTER — Other Ambulatory Visit (HOSPITAL_COMMUNITY)
Admission: RE | Admit: 2018-12-28 | Discharge: 2018-12-28 | Disposition: A | Discharge status: Home / Self Care | Payer: 59 | Source: Ambulatory Visit | Attending: Family Medicine | Admitting: Family Medicine

## 2018-12-28 ENCOUNTER — Other Ambulatory Visit: Payer: Self-pay

## 2018-12-28 DIAGNOSIS — R300 Dysuria: Secondary | ICD-10-CM | POA: Diagnosis present

## 2018-12-28 DIAGNOSIS — R1030 Lower abdominal pain, unspecified: Secondary | ICD-10-CM

## 2018-12-28 LAB — POCT URINALYSIS DIP (MANUAL ENTRY)
Bilirubin, UA: NEGATIVE
Blood, UA: NEGATIVE
Glucose, UA: NEGATIVE mg/dL
Ketones, POC UA: NEGATIVE mg/dL
Leukocytes, UA: NEGATIVE
Nitrite, UA: NEGATIVE
Protein Ur, POC: NEGATIVE mg/dL
Spec Grav, UA: 1.02
Urobilinogen, UA: 0.2 U/dL
pH, UA: 7

## 2018-12-28 NOTE — Discharge Instructions (Addendum)
Increase fluid intake.  May take Ibuprofen 200mg, 4 tabs every 8 hours with food.  °If symptoms become significantly worse during the night or over the weekend, proceed to the local emergency room.  °

## 2018-12-28 NOTE — ED Provider Notes (Signed)
[facility]    CSN: [id number] Arrival date & time: 12/28/18  1324     History   Chief Complaint Chief Complaint  Patient presents with  . Dysuria    HPI Amber Olsen is a 38 y.o. female.   Patient complains of onset of mild dysuria for a day or two, about one week ago.  This was followed by persistent mild lower back and lower abdominal discomfort.  No fevers, chills, and sweats.  No nausea/vomiting.  She has had slightly more watery vaginal discharge than usual.  Patient's last menstrual period was 12/06/2018 (exact date).   The history is provided by the patient.  Dysuria  Quality: pressure-like. Pain severity:  Mild Onset quality:  Gradual Duration:  1 week Timing:  Constant Progression:  Unchanged Chronicity:  New Recent urinary tract infections: no   Relieved by:  NSAIDs Worsened by:  Nothing Ineffective treatments:  None tried Urinary symptoms: no discolored urine, no foul-smelling urine, no frequent urination, no hematuria and no bladder incontinence   Associated symptoms: abdominal pain and vaginal discharge   Associated symptoms: no fever, no flank pain, no genital lesions, no nausea and no vomiting     Past Medical History:  Diagnosis Date  . Migraines     There are no active problems to display for this patient.   History reviewed. No pertinent surgical history.  OB History    Gravida  4   Para  1   Term  1   Preterm  0   AB  2   Living  1     SAB  1   TAB  1   Ectopic  0   Multiple  0   Live Births               Home Medications    Prior to Admission medications   Medication Sig Start Date End Date Taking? Authorizing Provider  ibuprofen (ADVIL,MOTRIN) 600 MG tablet Take 1 tablet (600 mg total) by mouth every 6 (six) hours as needed. 01/29/18   Derwood KaplanNanavati, Ankit, MD    Family History Family History  Problem Relation Age of Onset  . Healthy Mother   . Healthy Father     Social History Social  History   Tobacco Use  . Smoking status: Never Smoker  . Smokeless tobacco: Never Used  Substance Use Topics  . Alcohol use: No  . Drug use: No     Allergies   Patient has no known allergies.   Review of Systems Review of Systems  Constitutional: Negative for activity change, chills, diaphoresis, fatigue and fever.  Gastrointestinal: Positive for abdominal pain. Negative for nausea and vomiting.  Genitourinary: Positive for dysuria and vaginal discharge. Negative for flank pain, frequency, genital sores, hematuria, pelvic pain, urgency and vaginal pain.  All other systems reviewed and are negative.    Physical Exam Triage Vital Signs ED Triage Vitals  Enc Vitals Group     BP 12/28/18 1356 124/80     Pulse Rate 12/28/18 1356 75     Resp --      Temp 12/28/18 1356 99.1 F (37.3 C)     Temp Source 12/28/18 1356 Oral     SpO2 12/28/18 1356 100 %     Weight 12/28/18 1358 162 lb (73.5 kg)     Height 12/28/18 1358 5\' 6"  (1.676 m)     Head Circumference --      Peak Flow --  Pain Score 12/28/18 1357 2     Pain Loc --      Pain Edu? --      Excl. in GC? --    No data found.  Updated Vital Signs BP 124/80 (BP Location: Right Arm)   Pulse 75   Temp 99.1 F (37.3 C) (Oral)   Ht 5\' 6"  (1.676 m)   Wt 73.5 kg   LMP 12/06/2018 (Exact Date)   SpO2 100%   BMI 26.15 kg/m   Visual Acuity Right Eye Distance:   Left Eye Distance:   Bilateral Distance:    Right Eye Near:   Left Eye Near:    Bilateral Near:     Physical Exam Nursing notes and Vital Signs reviewed. Appearance:  Patient appears stated age, and in no acute distress.    Eyes:  Pupils are equal, round, and reactive to light and accomodation.  Extraocular movement is intact.  Conjunctivae are not inflamed   Pharynx:  Normal; moist mucous membranes  Neck:  Supple.  No adenopathy Lungs:  Clear to auscultation.  Breath sounds are equal.  Moving air well. Heart:  Regular rate and rhythm without murmurs,  rubs, or gallops.  Abdomen:  Nontender without masses or hepatosplenomegaly.  Bowel sounds are present.  No CVA or flank tenderness.  Extremities:  No edema.  Skin:  No rash present.     UC Treatments / Results  Labs (all labs ordered are listed, but only abnormal results are displayed) Labs Reviewed  URINE CULTURE  POCT URINALYSIS DIP (MANUAL ENTRY) negative  CERVICOVAGINAL ANCILLARY ONLY    EKG None  Radiology No results found.  Procedures Procedures (including critical care time)  Medications Ordered in UC Medications - No data to display  Initial Impression / Assessment and Plan / UC Course  I have reviewed the triage vital signs and the nursing notes.  Pertinent labs & imaging results that were available during my care of the patient were reviewed by me and considered in my medical decision making (see chart for details).    No evidence UTI today.  Will send urine culture. Suspect vaginitis; ?BV.  Will send Cervicovaginal Ancillary.   Treat according to test results. Followup with Family Doctor if not improved in one week.   Final Clinical Impressions(s) / UC Diagnoses   Final diagnoses:  Dysuria  Lower abdominal pain     Discharge Instructions     Increase fluid intake. May take Ibuprofen 200mg , 4 tabs every 8 hours with food.   If symptoms become significantly worse during the night or over the weekend, proceed to the local emergency room.     ED Prescriptions    None         Lattie Haw, MD 12/28/18 1431

## 2018-12-28 NOTE — ED Triage Notes (Signed)
Dysuria, pressure, LBP x 1 week

## 2018-12-29 LAB — CERVICOVAGINAL ANCILLARY ONLY
Bacterial vaginitis: NEGATIVE
Candida vaginitis: NEGATIVE
Chlamydia: NEGATIVE
Neisseria Gonorrhea: NEGATIVE
Trichomonas: NEGATIVE

## 2018-12-29 LAB — URINE CULTURE
MICRO NUMBER:: 514730
Result:: NO GROWTH
SPECIMEN QUALITY:: ADEQUATE

## 2018-12-31 ENCOUNTER — Telehealth: Payer: Self-pay

## 2018-12-31 NOTE — Telephone Encounter (Signed)
Pt notified of neg lab results. Says shes feeling better since UC visit. Advised if sxs return to follow up with PCP.

## 2019-06-03 DIAGNOSIS — U071 COVID-19: Secondary | ICD-10-CM

## 2019-06-03 HISTORY — DX: COVID-19: U07.1

## 2019-06-16 ENCOUNTER — Other Ambulatory Visit: Payer: Self-pay

## 2019-06-16 ENCOUNTER — Encounter (HOSPITAL_BASED_OUTPATIENT_CLINIC_OR_DEPARTMENT_OTHER): Payer: Self-pay | Admitting: *Deleted

## 2019-06-16 ENCOUNTER — Emergency Department (HOSPITAL_BASED_OUTPATIENT_CLINIC_OR_DEPARTMENT_OTHER)
Admission: EM | Admit: 2019-06-16 | Discharge: 2019-06-16 | Disposition: A | Discharge status: Home / Self Care | Payer: 59 | Attending: Emergency Medicine | Admitting: Emergency Medicine

## 2019-06-16 DIAGNOSIS — J069 Acute upper respiratory infection, unspecified: Secondary | ICD-10-CM | POA: Diagnosis not present

## 2019-06-16 DIAGNOSIS — U071 COVID-19: Secondary | ICD-10-CM | POA: Insufficient documentation

## 2019-06-16 DIAGNOSIS — R509 Fever, unspecified: Secondary | ICD-10-CM | POA: Diagnosis present

## 2019-06-16 MED ORDER — KETOROLAC TROMETHAMINE 15 MG/ML IJ SOLN
15.0000 mg | Freq: Once | INTRAMUSCULAR | Status: AC
  Administered 2019-06-16: 15 mg via INTRAMUSCULAR
  Filled 2019-06-16: qty 1

## 2019-06-16 MED ORDER — BENZONATATE 100 MG PO CAPS: 100.0000 mg | ORAL_CAPSULE | Freq: Three times a day (TID) | ORAL | 0 refills | Status: DC

## 2019-06-16 MED ORDER — ONDANSETRON 4 MG PO TBDP: ORAL_TABLET | ORAL | 0 refills | Status: DC

## 2019-06-16 MED ORDER — ACETAMINOPHEN 500 MG PO TABS
1000.0000 mg | ORAL_TABLET | Freq: Once | ORAL | Status: AC
  Administered 2019-06-16: 1000 mg via ORAL
  Filled 2019-06-16: qty 2

## 2019-06-16 NOTE — ED Provider Notes (Signed)
[place] EMERGENCY DEPARTMENT Provider Note   CSN: [id number] Arrival date & time: 06/16/19  0008     History   Chief Complaint Chief Complaint  Patient presents with  . Headache  . Sore Throat    HPI Amber Olsen is a 38 y.o. female.     38 yo F with a chief complaints of fever headache sore throat cough and myalgias.  Going on for about 12 hours now.  Patient's brother has a similar illness and cannot smell or taste anything.  She is concerned that she may have the novel coronavirus.  Denies any other sick contacts.  Having some nausea without vomiting.  The history is provided by the patient.  Headache Associated symptoms: no congestion, no dizziness, no fever, no myalgias, no nausea and no vomiting   Sore Throat Associated symptoms include headaches. Pertinent negatives include no chest pain and no shortness of breath.  Illness Severity:  Moderate Onset quality:  Gradual Duration:  12 hours Timing:  Constant Progression:  Unchanged Chronicity:  New Associated symptoms: headaches   Associated symptoms: no chest pain, no congestion, no fever, no myalgias, no nausea, no rhinorrhea, no shortness of breath, no vomiting and no wheezing     Past Medical History:  Diagnosis Date  . Migraines     There are no active problems to display for this patient.   History reviewed. No pertinent surgical history.   OB History    Gravida  4   Para  1   Term  1   Preterm  0   AB  2   Living  1     SAB  1   TAB  1   Ectopic  0   Multiple  0   Live Births               Home Medications    Prior to Admission medications   Medication Sig Start Date End Date Taking? Authorizing Provider  benzonatate (TESSALON) 100 MG capsule Take 1 capsule (100 mg total) by mouth every 8 (eight) hours. 06/16/19   Deno Etienne, DO  ibuprofen (ADVIL,MOTRIN) 600 MG tablet Take 1 tablet (600 mg total) by mouth every 6 (six) hours as needed. 01/29/18    Varney Biles, MD  ondansetron (ZOFRAN ODT) 4 MG disintegrating tablet 4mg  ODT q4 hours prn nausea/vomit 06/16/19   Deno Etienne, DO    Family History Family History  Problem Relation Age of Onset  . Healthy Mother   . Healthy Father     Social History Social History   Tobacco Use  . Smoking status: Never Smoker  . Smokeless tobacco: Never Used  Substance Use Topics  . Alcohol use: No  . Drug use: No     Allergies   Patient has no known allergies.   Review of Systems Review of Systems  Constitutional: Negative for chills and fever.  HENT: Negative for congestion and rhinorrhea.   Eyes: Negative for redness and visual disturbance.  Respiratory: Negative for shortness of breath and wheezing.   Cardiovascular: Negative for chest pain and palpitations.  Gastrointestinal: Negative for nausea and vomiting.  Genitourinary: Negative for dysuria and urgency.  Musculoskeletal: Negative for arthralgias and myalgias.  Skin: Negative for pallor and wound.  Neurological: Positive for headaches. Negative for dizziness.     Physical Exam Updated Vital Signs BP 112/80 (BP Location: Left Arm)   Pulse 79   Temp 99.8 F (37.7 C) (Oral)   Resp 18  Ht 5\' 6"  (1.676 m)   Wt 69.4 kg   LMP 05/18/2019 (Exact Date)   SpO2 100%   Breastfeeding Yes   BMI 24.69 kg/m   Physical Exam Vitals signs and nursing note reviewed.  Constitutional:      General: She is not in acute distress.    Appearance: She is well-developed. She is not diaphoretic.  HENT:     Head: Normocephalic and atraumatic.  Eyes:     Pupils: Pupils are equal, round, and reactive to light.  Neck:     Musculoskeletal: Normal range of motion and neck supple.  Cardiovascular:     Rate and Rhythm: Normal rate and regular rhythm.     Heart sounds: No murmur. No friction rub. No gallop.   Pulmonary:     Effort: Pulmonary effort is normal.     Breath sounds: No wheezing or rales.  Abdominal:     General: There is  no distension.     Palpations: Abdomen is soft.     Tenderness: There is no abdominal tenderness.  Musculoskeletal:        General: No tenderness.  Skin:    General: Skin is warm and dry.  Neurological:     Mental Status: She is alert and oriented to person, place, and time.  Psychiatric:        Behavior: Behavior normal.      ED Treatments / Results  Labs (all labs ordered are listed, but only abnormal results are displayed) Labs Reviewed  NOVEL CORONAVIRUS, NAA (HOSP ORDER, SEND-OUT TO REF LAB; TAT 18-24 HRS)    EKG None  Radiology No results found.  Procedures Procedures (including critical care time)  Medications Ordered in ED Medications  acetaminophen (TYLENOL) tablet 1,000 mg (1,000 mg Oral Given 06/16/19 0156)  ketorolac (TORADOL) 15 MG/ML injection 15 mg (15 mg Intramuscular Given 06/16/19 0157)     Initial Impression / Assessment and Plan / ED Course  I have reviewed the triage vital signs and the nursing notes.  Pertinent labs & imaging results that were available during my care of the patient were reviewed by me and considered in my medical decision making (see chart for details).        38 yo F with a chief complaints of a viral-like syndrome.  Unfortunately this is occurring during the novel coronavirus.  Patient is well-appearing nontoxic does not require oxygen and is not tachypneic.  We will send an outpatient test.  When patient self isolate.  Symptomatic control at home.   3:30 AM:  I have discussed the diagnosis/risks/treatment options with the patient and believe the pt to be eligible for discharge home to follow-up with PCP. We also discussed returning to the ED immediately if new or worsening sx occur. We discussed the sx which are most concerning (e.g., sudden worsening pain, fever, inability to tolerate by mouth) that necessitate immediate return. Medications administered to the patient during their visit and any new prescriptions provided to  the patient are listed below.  Medications given during this visit Medications  acetaminophen (TYLENOL) tablet 1,000 mg (1,000 mg Oral Given 06/16/19 0156)  ketorolac (TORADOL) 15 MG/ML injection 15 mg (15 mg Intramuscular Given 06/16/19 0157)     The patient appears reasonably screen and/or stabilized for discharge and I doubt any other medical condition or other [facility] requiring further screening, evaluation, or treatment in the ED at this time prior to discharge.    Final Clinical Impressions(s) / ED Diagnoses   Final  diagnoses:  Viral upper respiratory tract infection    ED Discharge Orders         Ordered    benzonatate (TESSALON) 100 MG capsule  Every 8 hours     06/16/19 0152    ondansetron (ZOFRAN ODT) 4 MG disintegrating tablet     06/16/19 0152           Melene Plan, DO 06/16/19 0330

## 2019-06-16 NOTE — Discharge Instructions (Addendum)
Take tylenol 2 pills 4 times a day and motrin 4 pills 3 times a day.  Drink plenty of fluids.  Return for worsening shortness of breath, headache, confusion. Follow up with your family doctor.      Person Under Monitoring Name: Amber Olsen  Location: [address] [place] [id number]   Infection Prevention Recommendations for Individuals Confirmed to have, or Being Evaluated for, 2019 Novel Coronavirus (COVID-19) Infection Who Receive Care at Home  Individuals who are confirmed to have, or are being evaluated for, COVID-19 should follow the prevention steps below until a healthcare provider or local or state health department says they can return to normal activities.  Stay home except to get medical care You should restrict activities outside your home, except for getting medical care. Do not go to work, school, or public areas, and do not use public transportation or taxis.  Call ahead before visiting your doctor Before your medical appointment, call the healthcare provider and tell them that you have, or are being evaluated for, COVID-19 infection. This will help the healthcare providers office take steps to keep other people from getting infected. Ask your healthcare provider to call the local or state health department.  Monitor your symptoms Seek prompt medical attention if your illness is worsening (e.g., difficulty breathing). Before going to your medical appointment, call the healthcare provider and tell them that you have, or are being evaluated for, COVID-19 infection. Ask your healthcare provider to call the local or state health department.  Wear a facemask You should wear a facemask that covers your nose and mouth when you are in the same room with other people and when you visit a healthcare provider. People who live with or visit you should also wear a facemask while they are in the same room with you.  Separate yourself from other people in your home As  much as possible, you should stay in a different room from other people in your home. Also, you should use a separate bathroom, if available.  Avoid sharing household items You should not share dishes, drinking glasses, cups, eating utensils, towels, bedding, or other items with other people in your home. After using these items, you should wash them thoroughly with soap and water.  Cover your coughs and sneezes Cover your mouth and nose with a tissue when you cough or sneeze, or you can cough or sneeze into your sleeve. Throw used tissues in a lined trash can, and immediately wash your hands with soap and water for at least 20 seconds or use an alcohol-based hand rub.  Wash your [employer]hands Wash your hands often and thoroughly with soap and water for at least 20 seconds. You can use an alcohol-based hand sanitizer if soap and water are not available and if your hands are not visibly dirty. Avoid touching your eyes, nose, and mouth with unwashed hands.   Prevention Steps for Caregivers and Household Members of Individuals Confirmed to have, or Being Evaluated for, COVID-19 Infection Being Cared for in the Home  If you live with, or provide care at home for, a person confirmed to have, or being evaluated for, COVID-19 infection please follow these guidelines to prevent infection:  Follow healthcare providers instructions Make sure that you understand and can help the patient follow any healthcare provider instructions for all care.  Provide for the patients basic needs You should help the patient with basic needs in the home and provide support for getting groceries, prescriptions, and other  personal needs.  Monitor the patients symptoms If they are getting sicker, call his or her medical provider and tell them that the patient has, or is being evaluated for, COVID-19 infection. This will help the healthcare providers office take steps to keep other people from getting infected. Ask  the healthcare provider to call the local or state health department.  Limit the number of people who have contact with the patient If possible, have only one caregiver for the patient. Other household members should stay in another home or place of residence. If this is not possible, they should stay in another room, or be separated from the patient as much as possible. Use a separate bathroom, if available. Restrict visitors who do not have an essential need to be in the home.  Keep older adults, very young children, and other sick people away from the patient Keep older adults, very young children, and those who have compromised immune systems or chronic health conditions away from the patient. This includes people with chronic heart, lung, or kidney conditions, diabetes, and cancer.  Ensure good ventilation Make sure that shared spaces in the home have good air flow, such as from an air conditioner or an opened window, weather permitting.  Wash your hands often Wash your hands often and thoroughly with soap and water for at least 20 seconds. You can use an alcohol based hand sanitizer if soap and water are not available and if your hands are not visibly dirty. Avoid touching your eyes, nose, and mouth with unwashed hands. Use disposable paper towels to dry your hands. If not available, use dedicated cloth towels and replace them when they become wet.  Wear a facemask and gloves Wear a disposable facemask at all times in the room and gloves when you touch or have contact with the patients blood, body fluids, and/or secretions or excretions, such as sweat, saliva, sputum, nasal mucus, vomit, urine, or feces.  Ensure the mask fits over your nose and mouth tightly, and do not touch it during use. Throw out disposable facemasks and gloves after using them. Do not reuse. Wash your hands immediately after removing your facemask and gloves. If your personal clothing becomes contaminated,  carefully remove clothing and launder. Wash your hands after handling contaminated clothing. Place all used disposable facemasks, gloves, and other waste in a lined container before disposing them with other household waste. Remove gloves and wash your hands immediately after handling these items.  Do not share dishes, glasses, or other household items with the patient Avoid sharing household items. You should not share dishes, drinking glasses, cups, eating utensils, towels, bedding, or other items with a patient who is confirmed to have, or being evaluated for, COVID-19 infection. After the person uses these items, you should wash them thoroughly with soap and water.  Wash laundry thoroughly Immediately remove and wash clothes or bedding that have blood, body fluids, and/or secretions or excretions, such as sweat, saliva, sputum, nasal mucus, vomit, urine, or feces, on them. Wear gloves when handling laundry from the patient. Read and follow directions on labels of laundry or clothing items and detergent. In general, wash and dry with the warmest temperatures recommended on the label.  Clean all areas the individual has used often Clean all touchable surfaces, such as counters, tabletops, doorknobs, bathroom fixtures, toilets, phones, keyboards, tablets, and bedside tables, every day. Also, clean any surfaces that may have blood, body fluids, and/or secretions or excretions on them. Wear gloves when cleaning surfaces  the patient has come in contact with. Use a diluted bleach solution (e.g., dilute bleach with 1 part bleach and 10 parts water) or a household disinfectant with a label that says EPA-registered for coronaviruses. To make a bleach solution at home, add 1 tablespoon of bleach to 1 quart (4 cups) of water. For a larger supply, add  cup of bleach to 1 gallon (16 cups) of water. Read labels of cleaning products and follow recommendations provided on product labels. Labels contain  instructions for safe and effective use of the cleaning product including precautions you should take when applying the product, such as wearing gloves or eye protection and making sure you have good ventilation during use of the product. Remove gloves and wash hands immediately after cleaning.  Monitor yourself for signs and symptoms of illness Caregivers and household members are considered close contacts, should monitor their health, and will be asked to limit movement outside of the home to the extent possible. Follow the monitoring steps for close contacts listed on the symptom monitoring form.   ? If you have additional questions, contact your local health department or call the epidemiologist on call at [phone] (available 24/7). ? This guidance is subject to change. For the most up-to-date guidance from [facility], please refer to their website: [employer]

## 2019-06-16 NOTE — ED Notes (Signed)
Ibu taken approx 1800 yesterday 11/13, pt states helped bring fever and aches down some. C/o headache, sore throat, and lower back pain that radiates up the middle. No blurry vision, no loss of smell/taste. NAD. A&Ox4

## 2019-06-16 NOTE — ED Triage Notes (Signed)
Pt. Reports Friday morning she woke with a headache and sorethroat and fever of 100.5.  She reports just not feeling good.

## 2019-06-20 ENCOUNTER — Other Ambulatory Visit: Payer: Self-pay

## 2019-06-20 ENCOUNTER — Emergency Department (HOSPITAL_BASED_OUTPATIENT_CLINIC_OR_DEPARTMENT_OTHER)
Admission: EM | Admit: 2019-06-20 | Discharge: 2019-06-21 | Disposition: A | Discharge status: Home / Self Care | Payer: 59 | Attending: Emergency Medicine | Admitting: Emergency Medicine

## 2019-06-20 ENCOUNTER — Encounter (HOSPITAL_BASED_OUTPATIENT_CLINIC_OR_DEPARTMENT_OTHER): Payer: Self-pay | Admitting: *Deleted

## 2019-06-20 ENCOUNTER — Emergency Department (HOSPITAL_BASED_OUTPATIENT_CLINIC_OR_DEPARTMENT_OTHER): Payer: 59

## 2019-06-20 DIAGNOSIS — Z3202 Encounter for pregnancy test, result negative: Secondary | ICD-10-CM | POA: Insufficient documentation

## 2019-06-20 DIAGNOSIS — R0789 Other chest pain: Secondary | ICD-10-CM

## 2019-06-20 DIAGNOSIS — J159 Unspecified bacterial pneumonia: Secondary | ICD-10-CM | POA: Diagnosis not present

## 2019-06-20 DIAGNOSIS — U071 COVID-19: Secondary | ICD-10-CM | POA: Insufficient documentation

## 2019-06-20 LAB — PREGNANCY, URINE: Preg Test, Ur: NEGATIVE

## 2019-06-20 LAB — NOVEL CORONAVIRUS, NAA (HOSP ORDER, SEND-OUT TO REF LAB; TAT 18-24 HRS): SARS-CoV-2, NAA: DETECTED — AB

## 2019-06-20 NOTE — ED Provider Notes (Signed)
[facility] Provider Note: Georgena Spurling, MD, FACEP  CSN: [id number] MRN: [id number] ARRIVAL: 06/20/19 at 2038 ROOM: [place]  Chest Pain (Covid )   HISTORY OF PRESENT ILLNESS  06/20/19 11:50 PM Amber Olsen is a 38 y.o. female who was seen in the ED on 06/16/2019 for headache and sore throat and diagnosed with COVID-19.  She returns with chest pain for 1 day which she rates as a 10 out of 10.  It is located in the left lower chest and is not worse with deep breathing or coughing (she states she has not been coughing) but is worse with movement.  It has both sharp and dull components.  She has had nausea and vomiting for which she was given Zofran with improvement.  She denies shortness of breath at the present time but has had occasional shortness of breath with exertion.  She is now having a headache and pain in her right ear.   Past Medical History:  Diagnosis Date  . Migraines     History reviewed. No pertinent surgical history.  Family History  Problem Relation Age of Onset  . Healthy Mother   . Healthy Father     Social History   Tobacco Use  . Smoking status: Never Smoker  . Smokeless tobacco: Never Used  Substance Use Topics  . Alcohol use: No  . Drug use: No    Prior to Admission medications   Medication Sig Start Date End Date Taking? Authorizing Provider  acetaminophen (TYLENOL) 500 MG tablet Take 1,000 mg by mouth every 6 (six) hours as needed.   Yes [provider]  benzonatate (TESSALON) 100 MG capsule Take 1 capsule (100 mg total) by mouth every 8 (eight) hours. 06/16/19   Deno Etienne, DO  ibuprofen (ADVIL,MOTRIN) 600 MG tablet Take 1 tablet (600 mg total) by mouth every 6 (six) hours as needed. 01/29/18   Varney Biles, MD  ondansetron (ZOFRAN ODT) 4 MG disintegrating tablet 4mg  ODT q4 hours prn nausea/vomit 06/16/19   Deno Etienne, DO    Allergies Patient has no known allergies.   REVIEW OF SYSTEMS   Negative except as noted here or in the History of Present Illness.   PHYSICAL EXAMINATION  Initial Vital Signs Blood pressure 139/89, pulse 72, temperature 98.7 F (37.1 C), resp. rate 18, height 5\' 6"  (1.676 m), weight 69 kg, SpO2 100 %, currently breastfeeding.  Examination General: Well-developed, well-nourished female in no acute distress; appearance consistent with age of record HENT: normocephalic; atraumatic; TMs normal Eyes: pupils equal, round and reactive to light; extraocular muscles intact Neck: supple Heart: regular rate and rhythm Lungs: clear to auscultation bilaterally Abdomen: soft; nondistended; nontender; bowel sounds present Extremities: No deformity; full range of motion; pulses normal Neurologic: Awake, alert and oriented; motor function intact in all extremities and symmetric; no facial droop Skin: Warm and dry Psychiatric: Normal mood and affect   RESULTS  Summary of this visit's results, reviewed and interpreted by myself:   EKG Interpretation  Date/Time:    Ventricular Rate:    PR Interval:    QRS Duration:   QT Interval:    QTC Calculation:   R Axis:     Text Interpretation:        Laboratory Studies: Results for orders placed or performed during the hospital encounter of 06/20/19 (from the past 24 hour(s))  Pregnancy, urine     Status: None   Collection Time: 06/20/19 10:46 PM  Result Value  Ref Range   Preg Test, Ur NEGATIVE NEGATIVE   Imaging Studies: Dg Chest Port 1 View  Result Date: 06/20/2019 CLINICAL DATA:  COVID positive, chest pain EXAM: PORTABLE CHEST 1 VIEW COMPARISON:  10/21/2018 FINDINGS: Suspect early infiltrate at the left lung base. Right lung clear. Heart is normal size. No effusions or acute bony abnormality. IMPRESSION: Suspect early infiltrate left lung base. Electronically Signed   By: Charlett Nose M.D.   On: 06/20/2019 22:41    ED COURSE and MDM  Nursing notes, initial and subsequent vitals signs, including pulse  oximetry, reviewed and interpreted by myself.  Vitals:   06/20/19 2044 06/20/19 2047  BP:  139/89  Pulse:  72  Resp:  18  Temp:  98.7 F (37.1 C)  SpO2:  100%  Weight: 69 kg   Height: 5\' 6"  (1.676 m)    Medications  naproxen (NAPROSYN) tablet 500 mg (has no administration in time range)  doxycycline (VIBRA-TABS) tablet 100 mg (has no administration in time range)  ondansetron (ZOFRAN-ODT) disintegrating tablet 8 mg (has no administration in time range)    We will treat for bacterial pneumonia superimposed on COVID-19 infection.  PROCEDURES  Procedures   ED DIAGNOSES     ICD-10-CM   1. Secondary bacterial pneumonia  J15.9   2. COVID-19 virus infection  U07.1   3. Left-sided chest wall pain  R07.89        10-10-1985, MD 06/21/19 0001

## 2019-06-20 NOTE — ED Triage Notes (Signed)
Pt c/o CP x 1 day , seen here  3 days ago, DX covid

## 2019-06-20 NOTE — ED Notes (Signed)
Pt now c/o ear pain

## 2019-06-21 MED ORDER — FLUCONAZOLE 150 MG PO TABS: ORAL_TABLET | ORAL | 0 refills | Status: DC

## 2019-06-21 MED ORDER — ONDANSETRON 8 MG PO TBDP
8.0000 mg | ORAL_TABLET | Freq: Once | ORAL | Status: AC
  Administered 2019-06-21: 8 mg via ORAL
  Filled 2019-06-21: qty 1

## 2019-06-21 MED ORDER — NAPROXEN 500 MG PO TABS: ORAL_TABLET | ORAL | 0 refills | Status: DC

## 2019-06-21 MED ORDER — DOXYCYCLINE HYCLATE 100 MG PO CAPS: 100.0000 mg | ORAL_CAPSULE | Freq: Two times a day (BID) | ORAL | 0 refills | Status: DC

## 2019-06-21 MED ORDER — NAPROXEN 250 MG PO TABS
500.0000 mg | ORAL_TABLET | Freq: Once | ORAL | Status: AC
  Administered 2019-06-21: 500 mg via ORAL
  Filled 2019-06-21: qty 2

## 2019-06-21 MED ORDER — DOXYCYCLINE HYCLATE 100 MG PO TABS
100.0000 mg | ORAL_TABLET | Freq: Once | ORAL | Status: AC
  Administered 2019-06-21: 100 mg via ORAL
  Filled 2019-06-21: qty 1

## 2019-07-07 ENCOUNTER — Emergency Department (HOSPITAL_BASED_OUTPATIENT_CLINIC_OR_DEPARTMENT_OTHER)
Admission: EM | Admit: 2019-07-07 | Discharge: 2019-07-07 | Disposition: A | Discharge status: Home / Self Care | Payer: 59 | Attending: Emergency Medicine | Admitting: Emergency Medicine

## 2019-07-07 ENCOUNTER — Other Ambulatory Visit: Payer: Self-pay

## 2019-07-07 ENCOUNTER — Emergency Department (HOSPITAL_BASED_OUTPATIENT_CLINIC_OR_DEPARTMENT_OTHER): Payer: 59

## 2019-07-07 ENCOUNTER — Encounter (HOSPITAL_BASED_OUTPATIENT_CLINIC_OR_DEPARTMENT_OTHER): Payer: Self-pay | Admitting: Emergency Medicine

## 2019-07-07 DIAGNOSIS — R0781 Pleurodynia: Secondary | ICD-10-CM | POA: Diagnosis not present

## 2019-07-07 DIAGNOSIS — R0789 Other chest pain: Secondary | ICD-10-CM

## 2019-07-07 DIAGNOSIS — Z79899 Other long term (current) drug therapy: Secondary | ICD-10-CM | POA: Insufficient documentation

## 2019-07-07 DIAGNOSIS — U071 COVID-19: Secondary | ICD-10-CM | POA: Diagnosis not present

## 2019-07-07 LAB — BASIC METABOLIC PANEL
Anion gap: 6 (ref 5–15)
BUN: 11 mg/dL (ref 6–20)
CO2: 25 mmol/L (ref 22–32)
Calcium: 9.2 mg/dL (ref 8.9–10.3)
Chloride: 107 mmol/L (ref 98–111)
Creatinine, Ser: 0.69 mg/dL (ref 0.44–1.00)
GFR calc Af Amer: >60 mL/min (ref >60)
GFR calc non Af Amer: >60 mL/min (ref >60)
Glucose, Bld: 80 mg/dL (ref 70–99)
Potassium: 4.1 mmol/L (ref 3.5–5.1)
Sodium: 138 mmol/L (ref 135–145)

## 2019-07-07 LAB — CBC WITH DIFFERENTIAL/PLATELET
Abs Immature Granulocytes: 0.01 10*3/uL (ref 0.00–0.07)
Basophils Absolute: 0 10*3/uL (ref 0.0–0.1)
Basophils Relative: 1 %
Eosinophils Absolute: 0.3 10*3/uL (ref 0.0–0.5)
Eosinophils Relative: 6 %
HCT: 33.2 % — ABNORMAL LOW (ref 36.0–46.0)
Hemoglobin: 10.6 g/dL — ABNORMAL LOW (ref 12.0–15.0)
Immature Granulocytes: 0 %
Lymphocytes Relative: 45 %
Lymphs Abs: 2.7 10*3/uL (ref 0.7–4.0)
MCH: 24.2 pg — ABNORMAL LOW (ref 26.0–34.0)
MCHC: 31.9 g/dL (ref 30.0–36.0)
MCV: 75.8 fL — ABNORMAL LOW (ref 80.0–100.0)
Monocytes Absolute: 0.6 10*3/uL (ref 0.1–1.0)
Monocytes Relative: 11 %
Neutro Abs: 2.2 10*3/uL (ref 1.7–7.7)
Neutrophils Relative %: 37 %
Platelets: 250 10*3/uL (ref 150–400)
RBC: 4.38 MIL/uL (ref 3.87–5.11)
RDW: 14.4 % (ref 11.5–15.5)
WBC: 5.8 10*3/uL (ref 4.0–10.5)
nRBC: 0 % (ref 0.0–0.2)

## 2019-07-07 LAB — PREGNANCY, URINE: Preg Test, Ur: NEGATIVE

## 2019-07-07 LAB — D-DIMER, QUANTITATIVE: D-Dimer, Quant: 0.65 ug/mL-FEU — ABNORMAL HIGH (ref 0.00–0.50)

## 2019-07-07 LAB — BRAIN NATRIURETIC PEPTIDE: B Natriuretic Peptide: 11.4 pg/mL (ref 0.0–100.0)

## 2019-07-07 MED ORDER — BENZONATATE 100 MG PO CAPS: 100.0000 mg | ORAL_CAPSULE | Freq: Three times a day (TID) | ORAL | 0 refills | Status: DC

## 2019-07-07 MED ORDER — NAPROXEN 500 MG PO TABS: ORAL_TABLET | ORAL | 0 refills | Status: DC

## 2019-07-07 MED ORDER — IOHEXOL 350 MG/ML SOLN
75.0000 mL | Freq: Once | INTRAVENOUS | Status: AC | PRN
  Administered 2019-07-07: 75 mL via INTRAVENOUS

## 2019-07-07 NOTE — ED Provider Notes (Signed)
[facility] EMERGENCY DEPARTMENT Provider Note   CSN: [id number] Arrival date & time: 07/07/19  5681     History   Chief Complaint Chief Complaint  Patient presents with  . Chest Pain    COVID +    HPI Cathryne Mancebo is a 38 y.o. female.     The history is provided by the patient. No language interpreter was used.     38 year old female who was seen in the ED on 06/16/2019 for sore throat headache and subsequently diagnosed with COVID-19 presenting today complaining of chest pain.  Patient report after being diagnosed with COVID-19 she was giving antibiotic for potential lung infection.  She took the medication and states that her symptom is mostly resolved.  However for the past 2 days she has had intermittent pain to the left side of her chest.  She described pain as a sharp sensation across her chest happen sporadically sometimes worse with taking deep breath.  Pain is moderate in severity and has been intermittent.  No active pain at this time.  No fever or chills no lightheadedness or dizziness no shortness of breath, productive cough, hemoptysis, nausea vomiting diarrhea loss of taste or smell or rash.  She is not a smoker and no history of heart blood pressure.  She has been self quarantine.  Past Medical History:  Diagnosis Date  . Migraines     There are no active problems to display for this patient.   History reviewed. No pertinent surgical history.   OB History    Gravida  4   Para  1   Term  1   Preterm  0   AB  2   Living  1     SAB  1   TAB  1   Ectopic  0   Multiple  0   Live Births               Home Medications    Prior to Admission medications   Medication Sig Start Date End Date Taking? Authorizing Provider  benzonatate (TESSALON) 100 MG capsule Take 1 capsule (100 mg total) by mouth every 8 (eight) hours. 06/16/19   Melene Plan, DO  doxycycline (VIBRAMYCIN) 100 MG capsule Take 1 capsule (100 mg total) by mouth 2  (two) times daily. One po bid x 7 days 06/21/19   Molpus, John, MD  fluconazole (DIFLUCAN) 150 MG tablet Take 1 tablet as needed for yeast infection.  May repeat in 3 days if symptoms persist. 06/21/19   Molpus, Jonny Ruiz, MD  naproxen (NAPROSYN) 500 MG tablet Take 1 tablet twice daily as needed for body aches, chest pain or fever. 06/21/19   Molpus, John, MD  ondansetron (ZOFRAN ODT) 4 MG disintegrating tablet 4mg  ODT q4 hours prn nausea/vomit 06/16/19   06/18/19, DO    Family History Family History  Problem Relation Age of Onset  . Healthy Mother   . Healthy Father     Social History Social History   Tobacco Use  . Smoking status: Never Smoker  . Smokeless tobacco: Never Used  Substance Use Topics  . Alcohol use: No  . Drug use: No     Allergies   Patient has no known allergies.   Review of Systems Review of Systems  All other systems reviewed and are negative.    Physical Exam Updated Vital Signs BP 132/84 (BP Location: Left Arm)   Pulse 68   Temp 98 F (36.7 C) (Oral)  Resp 18   Ht 5\' 6"  (1.676 m)   Wt 69 kg   SpO2 100%   BMI 24.55 kg/m   Physical Exam Vitals signs and nursing note reviewed.  Constitutional:      General: She is not in acute distress.    Appearance: She is well-developed.  HENT:     Head: Atraumatic.  Eyes:     Conjunctiva/sclera: Conjunctivae normal.  Neck:     Musculoskeletal: Neck supple.  Cardiovascular:     Rate and Rhythm: Normal rate and regular rhythm.     Heart sounds: Normal heart sounds.  Pulmonary:     Effort: Pulmonary effort is normal.     Breath sounds: Normal breath sounds.  Chest:     Chest wall: No tenderness.  Abdominal:     Palpations: Abdomen is soft.  Musculoskeletal:     Right lower leg: No edema.     Left lower leg: No edema.  Skin:    Findings: No rash.  Neurological:     Mental Status: She is alert.      ED Treatments / Results  Labs (all labs ordered are listed, but only abnormal results  are displayed) Labs Reviewed  CBC WITH DIFFERENTIAL/PLATELET - Abnormal; Notable for the following components:      Result Value   Hemoglobin 10.6 (*)    HCT 33.2 (*)    MCV 75.8 (*)    MCH 24.2 (*)    All other components within normal limits  D-DIMER, QUANTITATIVE (NOT AT [facility]) - Abnormal; Notable for the following components:   D-Dimer, Quant 0.65 (*)    All other components within normal limits  BASIC METABOLIC PANEL  BRAIN NATRIURETIC PEPTIDE  PREGNANCY, URINE    EKG EKG Interpretation  Date/Time:  Saturday July 07 2019 18:47:43 EST Ventricular Rate:  70 PR Interval:    QRS Duration: 88 QT Interval:  386 QTC Calculation: 417 R Axis:   89 Text Interpretation: Sinus rhythm No significant change since last tracing Confirmed by Tilden Fossaees, Elizabeth [id number]([id number]) on 07/07/2019 7:12:59 PM   Radiology Ct Angio Chest Pe W And/or Wo Contrast  Result Date: 07/07/2019 CLINICAL DATA:  Covid positive, chest pain EXAM: CT ANGIOGRAPHY CHEST WITH CONTRAST TECHNIQUE: Multidetector CT imaging of the chest was performed using the standard protocol during bolus administration of intravenous contrast. Multiplanar CT image reconstructions and MIPs were obtained to evaluate the vascular anatomy. CONTRAST:  75mL OMNIPAQUE IOHEXOL 350 MG/ML SOLN COMPARISON:  October 21, 2018 FINDINGS: Cardiovascular: There is a optimal opacification of the pulmonary arteries. There is no central,segmental, or subsegmental filling defects within the pulmonary arteries. The heart is normal in size. No pericardial effusion or thickening. No evidence right heart strain. There is normal three-vessel brachiocephalic anatomy without proximal stenosis. The thoracic aorta is normal in appearance. Mediastinum/Nodes: No hilar, mediastinal, or axillary adenopathy. Thyroid gland, trachea, and esophagus demonstrate no significant findings. Lungs/Pleura: The lungs are clear. No pleural effusion or pneumothorax. No airspace consolidation. Upper  Abdomen: No acute abnormalities present in the visualized portions of the upper abdomen. Musculoskeletal: No chest wall abnormality. No acute or significant osseous findings. Review of the MIP images confirms the above findings. IMPRESSION: No central, segmental, or subsegmental pulmonary embolism. No acute intrathoracic pathology to explain the patient's symptoms. Electronically Signed   By: Jonna ClarkBindu  Avutu M.D.   On: 07/07/2019 21:34   Dg Chest Portable 1 View  Result Date: 07/07/2019 CLINICAL DATA:  COVID-19 positive.  Cough and fever. EXAM: PORTABLE CHEST  1 VIEW COMPARISON:  None. FINDINGS: The heart size and mediastinal contours are within normal limits. Both lungs are clear. The visualized skeletal structures are unremarkable. IMPRESSION: No active disease. Electronically Signed   By: Dorise Bullion III M.D   On: 07/07/2019 19:12    Procedures Procedures (including critical care time)  Medications Ordered in ED Medications  iohexol (OMNIPAQUE) 350 MG/ML injection 75 mL (75 mLs Intravenous Contrast Given 07/07/19 2117)     Initial Impression / Assessment and Plan / ED Course  I have reviewed the triage vital signs and the nursing notes.  Pertinent labs & imaging results that were available during my care of the patient were reviewed by me and considered in my medical decision making (see chart for details).        BP 132/84 (BP Location: Left Arm)   Pulse 68   Temp 98 F (36.7 C) (Oral)   Resp 18   Ht 5\' 6"  (1.676 m)   Wt 69 kg   SpO2 100%   BMI 24.55 kg/m    Final Clinical Impressions(s) / ED Diagnoses   Final diagnoses:  Atypical chest pain  COVID-19 virus infection    ED Discharge Orders         Ordered    benzonatate (TESSALON) 100 MG capsule  Every 8 hours     07/07/19 1950    naproxen (NAPROSYN) 500 MG tablet     07/07/19 1950         7:48 PM Patient was diagnosed with COVID-19 last month approximately 3 weeks ago, got better however presenting today  with intermittent left-sided chest discomfort.  She is well-appearing, afebrile, no hypoxia and no tachycardia.  Her lung exam is unremarkable, her chest exam is unremarkable.  Repeat chest x-ray today show no acute finding, EKG without any concerning changes to suggest viral pericarditis.  Low suspicion for PE as well.    I have discussed with pt and with DR. Ralene Bathe, will obtain D-dimer to r/o PE.  Work up initiated.   8:36 PM D-dimer is minimally elevated at 0.65.  Will obtain chest CT angiogram to rule out PE.  9:41 PM Labs are reassuring, chest CT angiogram without any evidence of PE or any concerning finding.  Patient felt reassured.  She is stable for discharge.  Return precautions discussed.   Domenic Moras, PA-C 07/07/19 2144    Quintella Reichert, MD 07/07/19 2227

## 2019-07-07 NOTE — Discharge Instructions (Addendum)
You have been evaluated for your chest pain.  Fortunately your chest x-ray today and your EKG looks normal.  Your chest CT angiogram did not show any evidence of blood clot in your lung or signs of pneumonia.  Your labs are reassuring.  Take cough medication and anti-inflammatory medication as needed.  Follow-up with your primary care provider for further care.  Return if you have any concern.

## 2019-07-07 NOTE — ED Triage Notes (Signed)
Patient states that she is COVID + and states that she is having is having chest pain since last night  - the patient also reports continued SOB

## 2019-11-26 ENCOUNTER — Encounter: Payer: Self-pay | Admitting: Emergency Medicine

## 2019-11-26 ENCOUNTER — Other Ambulatory Visit: Payer: Self-pay

## 2019-11-26 ENCOUNTER — Emergency Department (INDEPENDENT_AMBULATORY_CARE_PROVIDER_SITE_OTHER)
Admission: EM | Admit: 2019-11-26 | Discharge: 2019-11-26 | Disposition: A | Discharge status: Home / Self Care | Payer: BC Managed Care – PPO | Source: Home / Self Care

## 2019-11-26 ENCOUNTER — Other Ambulatory Visit (HOSPITAL_COMMUNITY)
Admission: RE | Admit: 2019-11-26 | Discharge: 2019-11-26 | Disposition: A | Discharge status: Home / Self Care | Payer: BC Managed Care – PPO | Source: Ambulatory Visit | Attending: Family Medicine | Admitting: Family Medicine

## 2019-11-26 DIAGNOSIS — B373 Candidiasis of vulva and vagina: Secondary | ICD-10-CM | POA: Insufficient documentation

## 2019-11-26 DIAGNOSIS — Z113 Encounter for screening for infections with a predominantly sexual mode of transmission: Secondary | ICD-10-CM | POA: Diagnosis present

## 2019-11-26 DIAGNOSIS — Z202 Contact with and (suspected) exposure to infections with a predominantly sexual mode of transmission: Secondary | ICD-10-CM | POA: Insufficient documentation

## 2019-11-26 NOTE — ED Provider Notes (Signed)
[facility]    CSN: [id number] Arrival date & time: 11/26/19  1427      History   Chief Complaint Chief Complaint  Patient presents with  . Exposure to STD    HPI Sunita Demond is a 39 y.o. female.   HPI  Niveah Boerner is a 39 y.o. female presenting to UC with request to be screened for STIs due to "a bad decision" about 2 weeks ago. Pt denies specific known exposure. Denies having symptoms including no abdominal pain, fever, n/v/d. dysuria or vaginal symptoms.  LMP: 11/06/19, pt not concerned for pregnancy.   Past Medical History:  Diagnosis Date  . COVID-19 06/2019  . Migraines     Patient Active Problem List   Diagnosis Date Noted  . COVID-19 06/2019    History reviewed. No pertinent surgical history.  OB History    Gravida  4   Para  1   Term  1   Preterm  0   AB  2   Living  1     SAB  1   TAB  1   Ectopic  0   Multiple  0   Live Births               Home Medications    Prior to Admission medications   Medication Sig Start Date End Date Taking? Authorizing Provider  benzonatate (TESSALON) 100 MG capsule Take 1 capsule (100 mg total) by mouth every 8 (eight) hours. 07/07/19   Domenic Moras, PA-C  doxycycline (VIBRAMYCIN) 100 MG capsule Take 1 capsule (100 mg total) by mouth 2 (two) times daily. One po bid x 7 days 06/21/19   Molpus, John, MD  fluconazole (DIFLUCAN) 150 MG tablet Take 1 tablet as needed for yeast infection.  May repeat in 3 days if symptoms persist. 06/21/19   Molpus, Jenny Reichmann, MD  naproxen (NAPROSYN) 500 MG tablet Take 1 tablet twice daily as needed for body aches, chest pain or fever. 07/07/19   Domenic Moras, PA-C  ondansetron (ZOFRAN ODT) 4 MG disintegrating tablet 4mg  ODT q4 hours prn nausea/vomit 06/16/19   Deno Etienne, DO    Family History Family History  Problem Relation Age of Onset  . Healthy Mother   . Healthy Father   . Healthy Sister   . Healthy Brother     Social History Social History    Tobacco Use  . Smoking status: Never Smoker  . Smokeless tobacco: Never Used  Substance Use Topics  . Alcohol use: No  . Drug use: No     Allergies   Patient has no known allergies.   Review of Systems Review of Systems  Constitutional: Negative for chills and fever.  Gastrointestinal: Negative for abdominal pain.  Genitourinary: Negative for dysuria, flank pain, frequency, genital sores, vaginal bleeding, vaginal discharge and vaginal pain.  Musculoskeletal: Negative for back pain.     Physical Exam Triage Vital Signs ED Triage Vitals  Enc Vitals Group     BP 11/26/19 1436 (!) 158/95     Pulse Rate 11/26/19 1436 72     Resp 11/26/19 1436 17     Temp 11/26/19 1436 98.8 F (37.1 C)     Temp Source 11/26/19 1436 Oral     SpO2 11/26/19 1436 100 %     Weight --      Height --      Head Circumference --      Peak Flow --      Pain  Score 11/26/19 1441 0     Pain Loc --      Pain Edu? --      Excl. in GC? --    No data found.  Updated Vital Signs BP (!) 158/95 (BP Location: Right Arm)   Pulse 72   Temp 98.8 F (37.1 C) (Oral)   Resp 17   LMP 11/06/2019 (Exact Date)   SpO2 100%   Visual Acuity Right Eye Distance:   Left Eye Distance:   Bilateral Distance:    Right Eye Near:   Left Eye Near:    Bilateral Near:     Physical Exam Vitals and nursing note reviewed.  Constitutional:      Appearance: Normal appearance. She is well-developed.  HENT:     Head: Normocephalic and atraumatic.  Cardiovascular:     Rate and Rhythm: Normal rate and regular rhythm.  Pulmonary:     Effort: Pulmonary effort is normal. No respiratory distress.     Breath sounds: Normal breath sounds.  Abdominal:     General: There is no distension.     Palpations: Abdomen is soft.     Tenderness: There is no abdominal tenderness. There is no right CVA tenderness or left CVA tenderness.  Genitourinary:    Comments: Deferred. Pt opted to provider vaginal self  swab Musculoskeletal:        General: Normal range of motion.     Cervical back: Normal range of motion.  Skin:    General: Skin is warm and dry.  Neurological:     Mental Status: She is alert and oriented to person, place, and time.  Psychiatric:        Behavior: Behavior normal.      UC Treatments / Results  Labs (all labs ordered are listed, but only abnormal results are displayed) Labs Reviewed  RPR  HIV ANTIBODY (ROUTINE TESTING W REFLEX)  CERVICOVAGINAL ANCILLARY ONLY    EKG   Radiology No results found.  Procedures Procedures (including critical care time)  Medications Ordered in UC Medications - No data to display  Initial Impression / Assessment and Plan / UC Course  I have reviewed the triage vital signs and the nursing notes.  Pertinent labs & imaging results that were available during my care of the patient were reviewed by me and considered in my medical decision making (see chart for details).    Vaginal swab and blood sent to lab for testing AVS provided  Final Clinical Impressions(s) / UC Diagnoses   Final diagnoses:  Screen for STD (sexually transmitted disease)     Discharge Instructions      If labs and/or imaging was ordered during your visit, you will ONLY be notified of abnormal test results or if medication changes are indicated. You may view all lab and imaging results in your free  MyChart Account.  If you do not have an account, please follow the instructions in this discharge paperwork.  The code provided for new patients is only good for 1 month so be sure to activate as soon as possible.      ED Prescriptions    None     PDMP not reviewed this encounter.   Lurene Shadow, [place] 11/26/19 1456

## 2019-11-26 NOTE — ED Triage Notes (Signed)
Pt came from therapy appointment & decided to have STD testing r/t "a bad decision" Denies any vaginal discharge at this time Requests STI testing - G/C/S/HIV

## 2019-11-26 NOTE — Discharge Instructions (Signed)
  If labs and/or imaging was ordered during your visit, you will ONLY be notified of abnormal test results or if medication changes are indicated. You may view all lab and imaging results in your free [facility] MyChart Account.  If you do not have an account, please follow the instructions in this discharge paperwork.  The code provided for new patients is only good for 1 month so be sure to activate as soon as possible.   

## 2019-11-27 LAB — CERVICOVAGINAL ANCILLARY ONLY
Bacterial Vaginitis (gardnerella): NEGATIVE
Candida Glabrata: NEGATIVE
Candida Vaginitis: POSITIVE — AB
Chlamydia: NEGATIVE
Comment: NEGATIVE
Comment: NEGATIVE
Comment: NEGATIVE
Comment: NEGATIVE
Comment: NEGATIVE
Comment: NORMAL
Neisseria Gonorrhea: NEGATIVE
Trichomonas: NEGATIVE

## 2019-11-27 LAB — RPR: RPR Ser Ql: NONREACTIVE

## 2019-11-27 LAB — EXTRA LAV TOP TUBE

## 2019-11-27 LAB — HIV ANTIBODY (ROUTINE TESTING W REFLEX): HIV 1&2 Ab, 4th Generation: NONREACTIVE

## 2019-11-28 ENCOUNTER — Telehealth: Payer: Self-pay | Admitting: Emergency Medicine

## 2019-11-28 MED ORDER — FLUCONAZOLE 150 MG PO TABS: 150.0000 mg | ORAL_TABLET | Freq: Once | ORAL | 0 refills | Status: AC

## 2019-11-28 NOTE — Telephone Encounter (Signed)
Pt called regarding lab results in my chart- + for yeast - no record of med being called into pharmacy - will follow up w/ Dr Cathren Harsh

## 2019-11-28 NOTE — Telephone Encounter (Signed)
Writer spoke w/ pt - confirmed name & DOB - escript sent to [facility] on [address]- confirmed pharmacy choice w/ pt

## 2020-12-13 ENCOUNTER — Emergency Department (HOSPITAL_BASED_OUTPATIENT_CLINIC_OR_DEPARTMENT_OTHER): Payer: BC Managed Care – PPO

## 2020-12-13 ENCOUNTER — Other Ambulatory Visit: Payer: Self-pay

## 2020-12-13 ENCOUNTER — Encounter (HOSPITAL_BASED_OUTPATIENT_CLINIC_OR_DEPARTMENT_OTHER): Payer: Self-pay

## 2020-12-13 DIAGNOSIS — R110 Nausea: Secondary | ICD-10-CM | POA: Insufficient documentation

## 2020-12-13 DIAGNOSIS — R0789 Other chest pain: Secondary | ICD-10-CM | POA: Insufficient documentation

## 2020-12-13 DIAGNOSIS — Z8616 Personal history of COVID-19: Secondary | ICD-10-CM | POA: Diagnosis not present

## 2020-12-13 LAB — BASIC METABOLIC PANEL
Anion gap: 10 (ref 5–15)
BUN: 8 mg/dL (ref 6–20)
CO2: 24 mmol/L (ref 22–32)
Calcium: 9.3 mg/dL (ref 8.9–10.3)
Chloride: 102 mmol/L (ref 98–111)
Creatinine, Ser: 0.66 mg/dL (ref 0.44–1.00)
GFR, Estimated: >60 mL/min (ref >60)
Glucose, Bld: 97 mg/dL (ref 70–99)
Potassium: 4 mmol/L (ref 3.5–5.1)
Sodium: 136 mmol/L (ref 135–145)

## 2020-12-13 LAB — CBC
HCT: 36.5 % (ref 36.0–46.0)
Hemoglobin: 11.7 g/dL — ABNORMAL LOW (ref 12.0–15.0)
MCH: 24.4 pg — ABNORMAL LOW (ref 26.0–34.0)
MCHC: 32.1 g/dL (ref 30.0–36.0)
MCV: 76 fL — ABNORMAL LOW (ref 80.0–100.0)
Platelets: 249 10*3/uL (ref 150–400)
RBC: 4.8 MIL/uL (ref 3.87–5.11)
RDW: 14 % (ref 11.5–15.5)
WBC: 7.3 10*3/uL (ref 4.0–10.5)
nRBC: 0 % (ref 0.0–0.2)

## 2020-12-13 LAB — PREGNANCY, URINE: Preg Test, Ur: NEGATIVE

## 2020-12-13 LAB — TROPONIN I (HIGH SENSITIVITY): Troponin I (High Sensitivity): <2 ng/L (ref <18)

## 2020-12-13 NOTE — ED Triage Notes (Signed)
Pt reports chest pain that comes and goes for the past 3 days. States it is usually sharp pain to the left side of her chest. Today reports chest tightness.

## 2020-12-14 ENCOUNTER — Emergency Department (HOSPITAL_BASED_OUTPATIENT_CLINIC_OR_DEPARTMENT_OTHER)
Admission: EM | Admit: 2020-12-14 | Discharge: 2020-12-14 | Disposition: A | Discharge status: Home / Self Care | Payer: BC Managed Care – PPO | Attending: Emergency Medicine | Admitting: Emergency Medicine

## 2020-12-14 DIAGNOSIS — R0789 Other chest pain: Secondary | ICD-10-CM

## 2020-12-14 LAB — TROPONIN I (HIGH SENSITIVITY): Troponin I (High Sensitivity): <2 ng/L (ref <18)

## 2020-12-14 MED ORDER — PANTOPRAZOLE SODIUM 40 MG IV SOLR
40.0000 mg | Freq: Once | INTRAVENOUS | Status: AC
  Administered 2020-12-14: 40 mg via INTRAVENOUS
  Filled 2020-12-14: qty 40

## 2020-12-14 MED ORDER — PANTOPRAZOLE SODIUM 40 MG PO TBEC: 40.0000 mg | DELAYED_RELEASE_TABLET | Freq: Every day | ORAL | 0 refills | Status: DC

## 2020-12-14 MED ORDER — SUCRALFATE 1 GM/10ML PO SUSP
1.0000 g | Freq: Once | ORAL | Status: AC
  Administered 2020-12-14: 1 g via ORAL
  Filled 2020-12-14: qty 10

## 2020-12-14 NOTE — ED Provider Notes (Signed)
[facility] Provider Note: Lowella Dell, MD, FACEP  CSN: [id number] MRN: [id number] ARRIVAL: 12/13/20 at 2210 ROOM: MH12/MH12   CHIEF COMPLAINT  Chest Pain   HISTORY OF PRESENT ILLNESS  12/14/20 1:48 AM Amber Olsen is a 40 y.o. female who has had chest pain for the past 4 days.  She states the chest pain has been constant.  The pain is described as a tightness or heaviness on the left side of her chest which pulsates.  That is it persistently waxes and wanes but never goes away.  Nothing brings it on, makes it worse or makes it better.  She has occasionally had shortness of breath but not always.  She has had some nausea with it but no diaphoresis.  She occasionally takes Zantac for heartburn but none recently.   Past Medical History:  Diagnosis Date  . COVID-19 06/2019  . Migraines     History reviewed. No pertinent surgical history.  Family History  Problem Relation Age of Onset  . Healthy Mother   . Healthy Father   . Healthy Sister   . Healthy Brother     Social History   Tobacco Use  . Smoking status: Never Smoker  . Smokeless tobacco: Never Used  Vaping Use  . Vaping Use: Never used  Substance Use Topics  . Alcohol use: No  . Drug use: No    Prior to Admission medications   Medication Sig Start Date End Date Taking? Authorizing Provider  pantoprazole (PROTONIX) 40 MG tablet Take 1 tablet (40 mg total) by mouth daily. 12/14/20  Yes Jenisa Monty, MD    Allergies Patient has no known allergies.   REVIEW OF SYSTEMS  Negative except as noted here or in the History of Present Illness.   PHYSICAL EXAMINATION  Initial Vital Signs Blood pressure (!) 125/99, pulse 71, temperature 98.5 F (36.9 C), temperature source Oral, resp. rate 18, height 5\' 6"  (1.676 m), weight 68 kg, last menstrual period 12/03/2020, SpO2 100 %, currently breastfeeding.  Examination General: Well-developed, well-nourished female in no acute distress; appearance  consistent with age of record HENT: normocephalic; atraumatic Eyes: pupils equal, round and reactive to light; extraocular muscles intact Neck: supple Heart: regular rate and rhythm Lungs: clear to auscultation bilaterally Chest: Nontender Abdomen: soft; nondistended; nontender; bowel sounds present Extremities: No deformity; full range of motion; pulses normal Neurologic: Awake, alert and oriented; motor function intact in all extremities and symmetric; no facial droop Skin: Warm and dry Psychiatric: Normal mood and affect   RESULTS  Summary of this visit's results, reviewed and interpreted by myself:   EKG Interpretation  Date/Time:  Saturday Dec 13 2020 22:37:46 EDT Ventricular Rate:  68 PR Interval:  144 QRS Duration: 82 QT Interval:  382 QTC Calculation: 406 R Axis:   92 Text Interpretation: Normal sinus rhythm No significant change was found Confirmed by [id number] (Paula Libra) on 12/14/2020 1:47:26 AM      Laboratory Studies: Results for orders placed or performed during the hospital encounter of 12/14/20 (from the past 24 hour(s))  Basic metabolic panel     Status: None   Collection Time: 12/13/20 10:39 PM  Result Value Ref Range   Sodium 136 135 - 145 mmol/L   Potassium 4.0 3.5 - 5.1 mmol/L   Chloride 102 98 - 111 mmol/L   CO2 24 22 - 32 mmol/L   Glucose, Bld 97 70 - 99 mg/dL   BUN 8 6 - 20 mg/dL   Creatinine, Ser  0.66 0.44 - 1.00 mg/dL   Calcium 9.3 8.9 - 32.9 mg/dL   GFR, Estimated >51 >88 mL/min   Anion gap 10 5 - 15  CBC     Status: Abnormal   Collection Time: 12/13/20 10:39 PM  Result Value Ref Range   WBC 7.3 4.0 - 10.5 K/uL   RBC 4.80 3.87 - 5.11 MIL/uL   Hemoglobin 11.7 (L) 12.0 - 15.0 g/dL   HCT 41.6 60.6 - 30.1 %   MCV 76.0 (L) 80.0 - 100.0 fL   MCH 24.4 (L) 26.0 - 34.0 pg   MCHC 32.1 30.0 - 36.0 g/dL   RDW 60.1 09.3 - 23.5 %   Platelets 249 150 - 400 K/uL   nRBC 0.0 0.0 - 0.2 %  Troponin I (High Sensitivity)     Status: None   Collection  Time: 12/13/20 10:39 PM  Result Value Ref Range   Troponin I (High Sensitivity) <2 <18 ng/L  Pregnancy, urine     Status: None   Collection Time: 12/13/20 10:39 PM  Result Value Ref Range   Preg Test, Ur NEGATIVE NEGATIVE  Troponin I (High Sensitivity)     Status: None   Collection Time: 12/14/20  2:07 AM  Result Value Ref Range   Troponin I (High Sensitivity) <2 <18 ng/L   Imaging Studies: DG Chest 2 View  Result Date: 12/13/2020 CLINICAL DATA:  Three days of intermittent left-sided chest pain. EXAM: CHEST - 2 VIEW COMPARISON:  July 07, 2019 FINDINGS: The heart size and mediastinal contours are within normal limits. Both lungs are clear. The visualized skeletal structures are unremarkable. IMPRESSION: No active cardiopulmonary disease. Electronically Signed   By: Maudry Mayhew MD   On: 12/13/2020 23:18    ED COURSE and MDM  Nursing notes, initial and subsequent vitals signs, including pulse oximetry, reviewed and interpreted by myself.  Vitals:   12/13/20 2229 12/13/20 2231 12/14/20 0134  BP:  (!) 125/99 (!) 125/99  Pulse:  71 71  Resp:  16 18  Temp:  98.5 F (36.9 C)   TempSrc:  Oral   SpO2:  100% 100%  Weight: 68 kg    Height: 5\' 6"  (1.676 m)     Medications  pantoprazole (PROTONIX) injection 40 mg (40 mg Intravenous Given 12/14/20 0204)  sucralfate (CARAFATE) 1 GM/10ML suspension 1 g (1 g Oral Given 12/14/20 0205)   3:00 AM Patient's EKG and chest x-ray are unremarkable.  She has 2 normal troponins.  She has no significant risk factors for coronary artery disease.  She did get relief with oral Carafate.  I suspect this is related to acid reflux and we will place her on a PPI.   PROCEDURES  Procedures   ED DIAGNOSES     ICD-10-CM   1. Atypical chest pain  R07.89        [id number], MD 12/14/20 [phone]

## 2021-01-29 ENCOUNTER — Other Ambulatory Visit: Payer: Self-pay

## 2021-01-29 ENCOUNTER — Encounter (HOSPITAL_BASED_OUTPATIENT_CLINIC_OR_DEPARTMENT_OTHER): Payer: Self-pay | Admitting: *Deleted

## 2021-01-29 ENCOUNTER — Emergency Department (HOSPITAL_BASED_OUTPATIENT_CLINIC_OR_DEPARTMENT_OTHER)
Admission: EM | Admit: 2021-01-29 | Discharge: 2021-01-29 | Disposition: A | Discharge status: Home / Self Care | Payer: BC Managed Care – PPO | Attending: Emergency Medicine | Admitting: Emergency Medicine

## 2021-01-29 ENCOUNTER — Emergency Department (HOSPITAL_BASED_OUTPATIENT_CLINIC_OR_DEPARTMENT_OTHER): Payer: BC Managed Care – PPO

## 2021-01-29 DIAGNOSIS — R0981 Nasal congestion: Secondary | ICD-10-CM | POA: Diagnosis not present

## 2021-01-29 DIAGNOSIS — Z8616 Personal history of COVID-19: Secondary | ICD-10-CM | POA: Insufficient documentation

## 2021-01-29 DIAGNOSIS — Z20822 Contact with and (suspected) exposure to covid-19: Secondary | ICD-10-CM | POA: Diagnosis not present

## 2021-01-29 DIAGNOSIS — R059 Cough, unspecified: Secondary | ICD-10-CM | POA: Insufficient documentation

## 2021-01-29 LAB — RESP PANEL BY RT-PCR (FLU A&B, COVID) ARPGX2
Influenza A by PCR: NEGATIVE
Influenza B by PCR: NEGATIVE
SARS Coronavirus 2 by RT PCR: NEGATIVE

## 2021-01-29 MED ORDER — DEXAMETHASONE 6 MG PO TABS
10.0000 mg | ORAL_TABLET | Freq: Once | ORAL | Status: AC
  Administered 2021-01-29: 10 mg via ORAL
  Filled 2021-01-29: qty 1

## 2021-01-29 NOTE — ED Triage Notes (Signed)
C/o covid sx x 6 days, fever cough , nasal congestion .

## 2021-01-29 NOTE — Discharge Instructions (Addendum)
Chest x-ray showed no evidence of pneumonia.  Overall suspect this is allergy related or possibly virus such as COVID or flu.  Suspect that symptoms will resolve on their own.

## 2021-01-29 NOTE — ED Provider Notes (Signed)
[facility] EMERGENCY DEPARTMENT Provider Note   CSN: [id number] Arrival date & time: 01/29/21  2124     History Chief Complaint  Patient presents with   Cough    Layni Kreamer is a 40 y.o. female.  The history is provided by the patient.  Cough Cough characteristics:  Non-productive Sputum characteristics:  Nondescript Severity:  Mild Onset quality:  Gradual Duration:  6 days Timing:  Intermittent Progression:  Waxing and waning Context: upper respiratory infection   Relieved by:  Nothing Worsened by:  Nothing Associated symptoms: sinus congestion   Associated symptoms: no chest pain, no chills, no fever, no shortness of breath and no sore throat       Past Medical History:  Diagnosis Date   COVID-19 06/2019   Migraines     Patient Active Problem List   Diagnosis Date Noted   COVID-19 06/2019    History reviewed. No pertinent surgical history.   OB History     Gravida  4   Para  1   Term  1   Preterm  0   AB  2   Living  1      SAB  1   IAB  1   Ectopic  0   Multiple  0   Live Births              Family History  Problem Relation Age of Onset   Healthy Mother    Healthy Father    Healthy Sister    Healthy Brother     Social History   Tobacco Use   Smoking status: Never   Smokeless tobacco: Never  Vaping Use   Vaping Use: Never used  Substance Use Topics   Alcohol use: No   Drug use: No    Home Medications Prior to Admission medications   Medication Sig Start Date End Date Taking? Authorizing Provider  pantoprazole (PROTONIX) 40 MG tablet Take 1 tablet (40 mg total) by mouth daily. 12/14/20   Molpus, John, MD  SRONYX 0.1-20 MG-MCG tablet Take 1 tablet by mouth daily. 12/31/20   [provider]    Allergies    Patient has no known allergies.  Review of Systems   Review of Systems  Constitutional:  Negative for chills and fever.  HENT:  Positive for congestion. Negative for sore throat,  tinnitus and trouble swallowing.   Eyes:  Positive for itching.  Respiratory:  Positive for cough. Negative for shortness of breath.   Cardiovascular:  Negative for chest pain.  Gastrointestinal:  Negative for nausea.   Physical Exam Updated Vital Signs BP (!) 152/87 (BP Location: Left Arm)   Pulse 69   Temp 98.5 F (36.9 C)   Resp 18   Ht 5\' 6"  (1.676 m)   Wt 68 kg   LMP 01/27/2021   SpO2 100%   BMI 24.21 kg/m   Physical Exam HENT:     Head: Normocephalic and atraumatic.     Nose: Nose normal.     Mouth/Throat:     Mouth: Mucous membranes are moist.  Eyes:     Extraocular Movements: Extraocular movements intact.     Pupils: Pupils are equal, round, and reactive to light.  Cardiovascular:     Pulses: Normal pulses.  Pulmonary:     Effort: Pulmonary effort is normal. No respiratory distress.     Breath sounds: Normal breath sounds. No stridor. No wheezing or rhonchi.  Musculoskeletal:     Cervical back: Normal  range of motion.  Neurological:     Mental Status: She is alert.    ED Results / Procedures / Treatments   Labs (all labs ordered are listed, but only abnormal results are displayed) Labs Reviewed  RESP PANEL BY RT-PCR (FLU A&B, COVID) [id number]    EKG None  Radiology DG Chest Portable 1 View  Result Date: 01/29/2021 CLINICAL DATA:  COVID positive x6 days with fever and cough. EXAM: PORTABLE CHEST 1 VIEW COMPARISON:  Dec 13, 2020 FINDINGS: The heart size and mediastinal contours are within normal limits. Both lungs are clear. The visualized skeletal structures are unremarkable. IMPRESSION: No active disease. Electronically Signed   By: Aram Candela M.D.   On: 01/29/2021 21:52    Procedures Procedures   Medications Ordered in ED Medications  dexamethasone (DECADRON) tablet 10 mg (has no administration in time range)    ED Course  I have reviewed the triage vital signs and the nursing notes.  Pertinent labs & imaging results that were available  during my care of the patient were reviewed by me and considered in my medical decision making (see chart for details).    MDM Rules/Calculators/A&P                          Zerline Melchior is here with cough.  Normal vitals.  No fever.  Negative home COVID test.  Suspect allergy versus viral process.  Chest x-ray showed no evidence of pneumonia or pneumothorax.  Clear breath sounds.  Given a dose of Decadron as suspect likely allergy related.  Will confirm negative COVID test with PCR test.  Discharged in good condition.  No respiratory distress.  This chart was dictated using voice recognition software.  Despite best efforts to proofread,  errors can occur which can change the documentation meaning.  Final Clinical Impression(s) / ED Diagnoses Final diagnoses:  Cough    Rx / DC Orders ED Discharge Orders     None        Virgina Norfolk, DO 01/29/21 2201

## 2021-01-29 NOTE — ED Notes (Signed)
Portable xray at bedside.

## 2021-01-31 ENCOUNTER — Emergency Department (HOSPITAL_BASED_OUTPATIENT_CLINIC_OR_DEPARTMENT_OTHER)
Admission: EM | Admit: 2021-01-31 | Discharge: 2021-01-31 | Disposition: A | Discharge status: Home / Self Care | Payer: BC Managed Care – PPO | Attending: Emergency Medicine | Admitting: Emergency Medicine

## 2021-01-31 ENCOUNTER — Encounter (HOSPITAL_BASED_OUTPATIENT_CLINIC_OR_DEPARTMENT_OTHER): Payer: Self-pay

## 2021-01-31 ENCOUNTER — Other Ambulatory Visit: Payer: Self-pay

## 2021-01-31 DIAGNOSIS — H1031 Unspecified acute conjunctivitis, right eye: Secondary | ICD-10-CM | POA: Diagnosis not present

## 2021-01-31 DIAGNOSIS — Z8616 Personal history of COVID-19: Secondary | ICD-10-CM | POA: Insufficient documentation

## 2021-01-31 DIAGNOSIS — J029 Acute pharyngitis, unspecified: Secondary | ICD-10-CM | POA: Diagnosis not present

## 2021-01-31 DIAGNOSIS — H5789 Other specified disorders of eye and adnexa: Secondary | ICD-10-CM | POA: Diagnosis present

## 2021-01-31 LAB — GROUP A STREP BY PCR: Group A Strep by PCR: NOT DETECTED

## 2021-01-31 MED ORDER — ERYTHROMYCIN 5 MG/GM OP OINT
TOPICAL_OINTMENT | Freq: Once | OPHTHALMIC | Status: AC
  Filled 2021-01-31: qty 3.5

## 2021-01-31 MED ORDER — ERYTHROMYCIN 5 MG/GM OP OINT: TOPICAL_OINTMENT | OPHTHALMIC | 0 refills | Status: DC

## 2021-01-31 MED ORDER — LIDOCAINE VISCOUS HCL 2 % MT SOLN: 15.0000 mL | OROMUCOSAL | 0 refills | Status: DC | PRN

## 2021-01-31 NOTE — Discharge Instructions (Addendum)
Swish and spit lidocaine to help with throat discomfort. Use the antibiotic eye ointment to help with your conjunctivitis. Follow-up with your primary care provider and eye doctor. Return to the ER if you start to experience worsening redness, drainage, pain with moving your eyes or swelling around your eyes

## 2021-01-31 NOTE — ED Triage Notes (Signed)
Pt presents with redness and milky drainage to R eye. Pt states she was seen a couple of days ago and was given steroids. Pt also reports a scratchy throat.

## 2021-01-31 NOTE — ED Provider Notes (Signed)
[facility] EMERGENCY DEPARTMENT Provider Note   CSN: [id number] Arrival date & time: 01/31/21  1813     History Chief Complaint  Patient presents with   Conjunctivitis    Amber Olsen is a 40 y.o. female presenting to the ED with a chief complaint of right eye redness and purulent drainage.  Symptoms have been going on for the past 3 days.  Also reports scratchy throat that began 2 days ago.  She was seen and evaluated 2 days ago for a cough.  She was given a shot of Decadron and had a negative COVID and flu test at that time.  She denies contact lens use.  Denies any blurry vision.  States that she will wake up in the morning with her eyelid matted together.  No sick contacts with similar symptoms.  No fever, trismus, drooling, trouble breathing or trouble swallowing. No trauma to the eye, swelling around eye, pain with EOMs or foreign body sensation.  HPI     Past Medical History:  Diagnosis Date   COVID-19 06/2019   Migraines     Patient Active Problem List   Diagnosis Date Noted   COVID-19 06/2019    History reviewed. No pertinent surgical history.   OB History     Gravida  4   Para  1   Term  1   Preterm  0   AB  2   Living  1      SAB  1   IAB  1   Ectopic  0   Multiple  0   Live Births              Family History  Problem Relation Age of Onset   Healthy Mother    Healthy Father    Healthy Sister    Healthy Brother     Social History   Tobacco Use   Smoking status: Never   Smokeless tobacco: Never  Vaping Use   Vaping Use: Never used  Substance Use Topics   Alcohol use: No   Drug use: No    Home Medications Prior to Admission medications   Medication Sig Start Date End Date Taking? Authorizing Provider  erythromycin ophthalmic ointment Place a 1/2 inch ribbon of ointment into the lower eyelid. 01/31/21  Yes Joshus Rogan, PA-C  lidocaine (XYLOCAINE) 2 % solution Use as directed 15 mLs in the mouth or throat as  needed for mouth pain. 01/31/21  Yes Witt Plitt, PA-C  pantoprazole (PROTONIX) 40 MG tablet Take 1 tablet (40 mg total) by mouth daily. 12/14/20   Molpus, John, MD  SRONYX 0.1-20 MG-MCG tablet Take 1 tablet by mouth daily. 12/31/20   [provider]    Allergies    Patient has no known allergies.  Review of Systems   Review of Systems  Constitutional:  Negative for fever.  HENT:  Positive for sore throat. Negative for trouble swallowing and voice change.   Eyes:  Positive for discharge and redness. Negative for photophobia, pain, itching and visual disturbance.   Physical Exam Updated Vital Signs BP (!) 144/99 (BP Location: Left Arm)   Pulse 79   Temp 98.8 F (37.1 C) (Oral)   Resp 16   Ht 5\' 6"  (1.676 m)   Wt 68 kg   LMP 01/27/2021   SpO2 100%   BMI 24.21 kg/m   Physical Exam Vitals and nursing note reviewed.  Constitutional:      General: She is not in acute  distress.    Appearance: She is well-developed. She is not diaphoretic.  HENT:     Head: Normocephalic and atraumatic.     Mouth/Throat:     Pharynx: No oropharyngeal exudate.     Tonsils: No tonsillar exudate. 1+ on the right. 1+ on the left.     Comments: Patient does not appear to be in acute distress. No trismus or drooling present. No pooling of secretions. Patient is tolerating secretions and is not in respiratory distress. No neck pain or tenderness to palpation of the neck. Full active and passive range of motion of the neck. No evidence of RPA or PTA. Eyes:     General: No scleral icterus.       Right eye: Discharge present.     Conjunctiva/sclera:     Right eye: Right conjunctiva is injected.     Comments: Right eye with very mild drainage noted. EOMs are intact.  Pupils are equal and reactive to light.  Pulmonary:     Effort: Pulmonary effort is normal. No respiratory distress.  Musculoskeletal:     Cervical back: Normal range of motion.  Skin:    Findings: No rash.  Neurological:     Mental  Status: She is alert.    ED Results / Procedures / Treatments   Labs (all labs ordered are listed, but only abnormal results are displayed) Labs Reviewed  GROUP A STREP BY PCR    EKG None  Radiology DG Chest Portable 1 View  Result Date: 01/29/2021 CLINICAL DATA:  COVID positive x6 days with fever and cough. EXAM: PORTABLE CHEST 1 VIEW COMPARISON:  Dec 13, 2020 FINDINGS: The heart size and mediastinal contours are within normal limits. Both lungs are clear. The visualized skeletal structures are unremarkable. IMPRESSION: No active disease. Electronically Signed   By: Aram Candela M.D.   On: 01/29/2021 21:52    Procedures Procedures   Medications Ordered in ED Medications - No data to display  ED Course  I have reviewed the triage vital signs and the nursing notes.  Pertinent labs & imaging results that were available during my care of the patient were reviewed by me and considered in my medical decision making (see chart for details).  Clinical Course as of 01/31/21 1908  Sat Jan 31, 2021  1905 Group A Strep by PCR: NOT DETECTED [HK]    Clinical Course User Index [HK] Dietrich Pates, PA-C   MDM Rules/Calculators/A&P                          Pt rapid strep test negative. Pt is tolerating secretions, not in respiratory distress, no neck pain, no trismus. Presentation not concerning for peritonsillar abscess or spread of infection to deep spaces of the throat; patent airway. Pt will be discharged with lidocaine to swish and spit. Ibuprofen or Tylenol as needed for pain/fever.   Suspect that her eye symptoms are due to conjunctivitis.  No evidence of swelling that would concern me for orbital cellulitis.  Will treat with antibiotic eyedrops.  Specific return precautions discussed. Recommended PCP follow up. Pt appears safe for discharge.    Patient is hemodynamically stable, in NAD, and able to ambulate in the ED. Evaluation does not show pathology that would require ongoing  emergent intervention or inpatient treatment. I explained the diagnosis to the patient. Pain has been managed and has no complaints prior to discharge. Patient is comfortable with above plan and is stable for discharge  at this time. All questions were answered prior to disposition. Strict return precautions for returning to the ED were discussed. Encouraged follow up with PCP.   An After Visit Summary was printed and given to the patient.   Portions of this note were generated with Scientist, clinical (histocompatibility and immunogenetics). Dictation errors may occur despite best attempts at proofreading.   Final Clinical Impression(s) / ED Diagnoses Final diagnoses:  Acute conjunctivitis of right eye, unspecified acute conjunctivitis type  Viral pharyngitis    Rx / DC Orders ED Discharge Orders          Ordered    lidocaine (XYLOCAINE) 2 % solution  As needed        01/31/21 1908    erythromycin ophthalmic ointment        01/31/21 1908             Dietrich Pates, Cordelia Poche 01/31/21 1908    Maia Plan, MD 02/03/21 [phone]

## 2021-11-24 DIAGNOSIS — Z1151 Encounter for screening for human papillomavirus (HPV): Secondary | ICD-10-CM | POA: Diagnosis not present

## 2021-11-24 DIAGNOSIS — Z01419 Encounter for gynecological examination (general) (routine) without abnormal findings: Secondary | ICD-10-CM | POA: Diagnosis not present

## 2021-11-24 DIAGNOSIS — Z118 Encounter for screening for other infectious and parasitic diseases: Secondary | ICD-10-CM | POA: Diagnosis not present

## 2021-11-24 DIAGNOSIS — Z113 Encounter for screening for infections with a predominantly sexual mode of transmission: Secondary | ICD-10-CM | POA: Diagnosis not present

## 2021-11-24 DIAGNOSIS — Z114 Encounter for screening for human immunodeficiency virus [HIV]: Secondary | ICD-10-CM | POA: Diagnosis not present

## 2021-12-30 ENCOUNTER — Encounter: Payer: Self-pay | Admitting: Emergency Medicine

## 2021-12-30 ENCOUNTER — Emergency Department (INDEPENDENT_AMBULATORY_CARE_PROVIDER_SITE_OTHER)
Admission: EM | Admit: 2021-12-30 | Discharge: 2021-12-30 | Disposition: A | Discharge status: Home / Self Care | Payer: BC Managed Care – PPO | Source: Home / Self Care | Attending: Family Medicine | Admitting: Family Medicine

## 2021-12-30 DIAGNOSIS — N3090 Cystitis, unspecified without hematuria: Secondary | ICD-10-CM

## 2021-12-30 DIAGNOSIS — R300 Dysuria: Secondary | ICD-10-CM

## 2021-12-30 LAB — POCT URINALYSIS DIP (MANUAL ENTRY)
Bilirubin, UA: NEGATIVE
Glucose, UA: NEGATIVE mg/dL
Ketones, POC UA: NEGATIVE mg/dL
Nitrite, UA: NEGATIVE
Protein Ur, POC: NEGATIVE mg/dL
Spec Grav, UA: 1.01 (ref 1.010–1.025)
Urobilinogen, UA: 0.2 E.U./dL
pH, UA: 7 (ref 5.0–8.0)

## 2021-12-30 MED ORDER — FLUCONAZOLE 150 MG PO TABS: ORAL_TABLET | ORAL | 0 refills | Status: DC

## 2021-12-30 MED ORDER — NITROFURANTOIN MONOHYD MACRO 100 MG PO CAPS: ORAL_CAPSULE | ORAL | 0 refills | Status: DC

## 2021-12-30 NOTE — Discharge Instructions (Signed)
Increase fluid intake. May use non-prescription AZO for about two days, if desired, to decrease urinary discomfort.  Begin Diflucan if vaginal yeast infection develops.  If symptoms become significantly worse during the night or over the weekend, proceed to the local emergency room.

## 2021-12-30 NOTE — ED Triage Notes (Signed)
Pain  & pressure w/ urination since Tuesday morning Ibuprofen 400mg  at 1800

## 2021-12-30 NOTE — ED Provider Notes (Signed)
[facility]    CSN: [id number] Arrival date & time: 12/30/21  1855      History   Chief Complaint Chief Complaint  Patient presents with   Dysuria    HPI Amber Olsen is a 41 y.o. female.   Yesterday morning patient developed lower abdominal pressure followed by dysuria and hesitancy.  She denies flank pain, pelvic pain, vaginal discharge and fever.  Patient's last menstrual period was 12/29/2021 (exact date).    The history is provided by the patient.  Dysuria Pain quality:  Burning Pain severity:  Mild Onset quality:  Sudden Duration:  1 day Timing:  Constant Progression:  Worsening Chronicity:  New Recent urinary tract infections: no   Relieved by:  None tried Worsened by:  Nothing Ineffective treatments:  None tried Urinary symptoms: frequent urination and hesitancy   Urinary symptoms: no discolored urine, no foul-smelling urine, no hematuria and no bladder incontinence   Associated symptoms: no abdominal pain, no fever, no flank pain, no nausea and no vaginal discharge    Past Medical History:  Diagnosis Date   COVID-19 06/2019   Migraines     Patient Active Problem List   Diagnosis Date Noted   COVID-19 06/2019    History reviewed. No pertinent surgical history.  OB History     Gravida  4   Para  1   Term  1   Preterm  0   AB  2   Living  1      SAB  1   IAB  1   Ectopic  0   Multiple  0   Live Births               Home Medications    Prior to Admission medications   Medication Sig Start Date End Date Taking? Authorizing Provider  fluconazole (DIFLUCAN) 150 MG tablet Take one tablet PO once.  May repeat in 72 hours. 12/30/21  Yes Lattie Haw, MD  nitrofurantoin, macrocrystal-monohydrate, (MACROBID) 100 MG capsule Take one cap PO Q12hr with food. 12/30/21  Yes Lattie Haw, MD  SRONYX 0.1-20 MG-MCG tablet Take 1 tablet by mouth daily. 12/31/20   [provider]    Family History Family  History  Problem Relation Age of Onset   Healthy Mother    Healthy Father    Healthy Sister    Healthy Brother     Social History Social History   Tobacco Use   Smoking status: Never   Smokeless tobacco: Never  Vaping Use   Vaping Use: Never used  Substance Use Topics   Alcohol use: No   Drug use: No     Allergies   Patient has no known allergies.   Review of Systems Review of Systems  Constitutional:  Negative for chills, diaphoresis, fatigue and fever.  Gastrointestinal:  Negative for abdominal pain and nausea.  Genitourinary:  Positive for dysuria. Negative for flank pain, hematuria, pelvic pain, urgency and vaginal discharge.  All other systems reviewed and are negative.   Physical Exam Triage Vital Signs ED Triage Vitals  Enc Vitals Group     BP 12/30/21 1906 (!) 158/95     Pulse Rate 12/30/21 1906 70     Resp 12/30/21 1906 17     Temp 12/30/21 1906 99.4 F (37.4 C)     Temp Source 12/30/21 1906 Oral     SpO2 12/30/21 1906 99 %     Weight 12/30/21 1907 160 lb (72.6 kg)  Height 12/30/21 1907 5\' 6"  (1.676 m)     Head Circumference --      Peak Flow --      Pain Score 12/30/21 1906 4     Pain Loc --      Pain Edu? --      Excl. in [place]? --    No data found.  Updated Vital Signs BP (!) 158/95 (BP Location: Right Arm)   Pulse 70   Temp 99.4 F (37.4 C) (Oral)   Resp 17   Ht 5\' 6"  (1.676 m)   Wt 72.6 kg   LMP 12/29/2021 (Exact Date)   SpO2 99%   Breastfeeding No   BMI 25.82 kg/m   Visual Acuity Right Eye Distance:   Left Eye Distance:   Bilateral Distance:    Right Eye Near:   Left Eye Near:    Bilateral Near:     Physical Exam Nursing notes and Vital Signs reviewed. Appearance:  Patient appears stated age, and in no acute distress.    Eyes:  Pupils are equal, round, and reactive to light and accomodation.  Extraocular movement is intact.  Conjunctivae are not inflamed   Pharynx:  Normal; moist mucous membranes  Neck:  Supple.  No  adenopathy Lungs:  Clear to auscultation.  Breath sounds are equal.  Moving air well. Heart:  Regular rate and rhythm without murmurs, rubs, or gallops.  Abdomen:  Nontender without masses or hepatosplenomegaly.  Bowel sounds are present.  No CVA or flank tenderness.  Extremities:  No edema.  Skin:  No rash present.     UC Treatments / Results  Labs (all labs ordered are listed, but only abnormal results are displayed) Labs Reviewed  POCT URINALYSIS DIP (MANUAL ENTRY) - Abnormal; Notable for the following components:      Result Value   Clarity, UA cloudy (*)    Blood, UA large (*)    Leukocytes, UA Small (1+) (*)    All other components within normal limits  URINE CULTURE    EKG   Radiology No results found.  Procedures Procedures (including critical care time)  Medications Ordered in UC Medications - No data to display  Initial Impression / Assessment and Plan / UC Course  I have reviewed the triage vital signs and the nursing notes.  Pertinent labs & imaging results that were available during my care of the patient were reviewed by me and considered in my medical decision making (see chart for details).    Begin Macrobid.  Urine culture pending. Rx for Diflucan if needed. Followup with Family Doctor if not improved in one week.   Final Clinical Impressions(s) / UC Diagnoses   Final diagnoses:  Dysuria  Cystitis     Discharge Instructions      Increase fluid intake. May use non-prescription AZO for about two days, if desired, to decrease urinary discomfort.  Begin Diflucan if vaginal yeast infection develops.  If symptoms become significantly worse during the night or over the weekend, proceed to the local emergency room.     ED Prescriptions     Medication Sig Dispense Auth. Provider   nitrofurantoin, macrocrystal-monohydrate, (MACROBID) 100 MG capsule Take one cap PO Q12hr with food. 14 capsule , MD   fluconazole (DIFLUCAN) 150 MG  tablet Take one tablet PO once.  May repeat in 72 hours. 2 tablet 12/31/2021, MD         Lattie Haw, MD 01/01/22 [phone]

## 2022-01-01 LAB — URINE CULTURE
MICRO NUMBER:: 13467801
SPECIMEN QUALITY:: ADEQUATE

## 2022-07-01 IMAGING — CR DG CHEST 2V
2 series · 2 of 2 positions shown · non-contrast
Comparison: July 07, 2019

CLINICAL DATA: Three days of intermittent left-sided chest pain.

EXAM:
CHEST - 2 VIEW

[w chest pa]
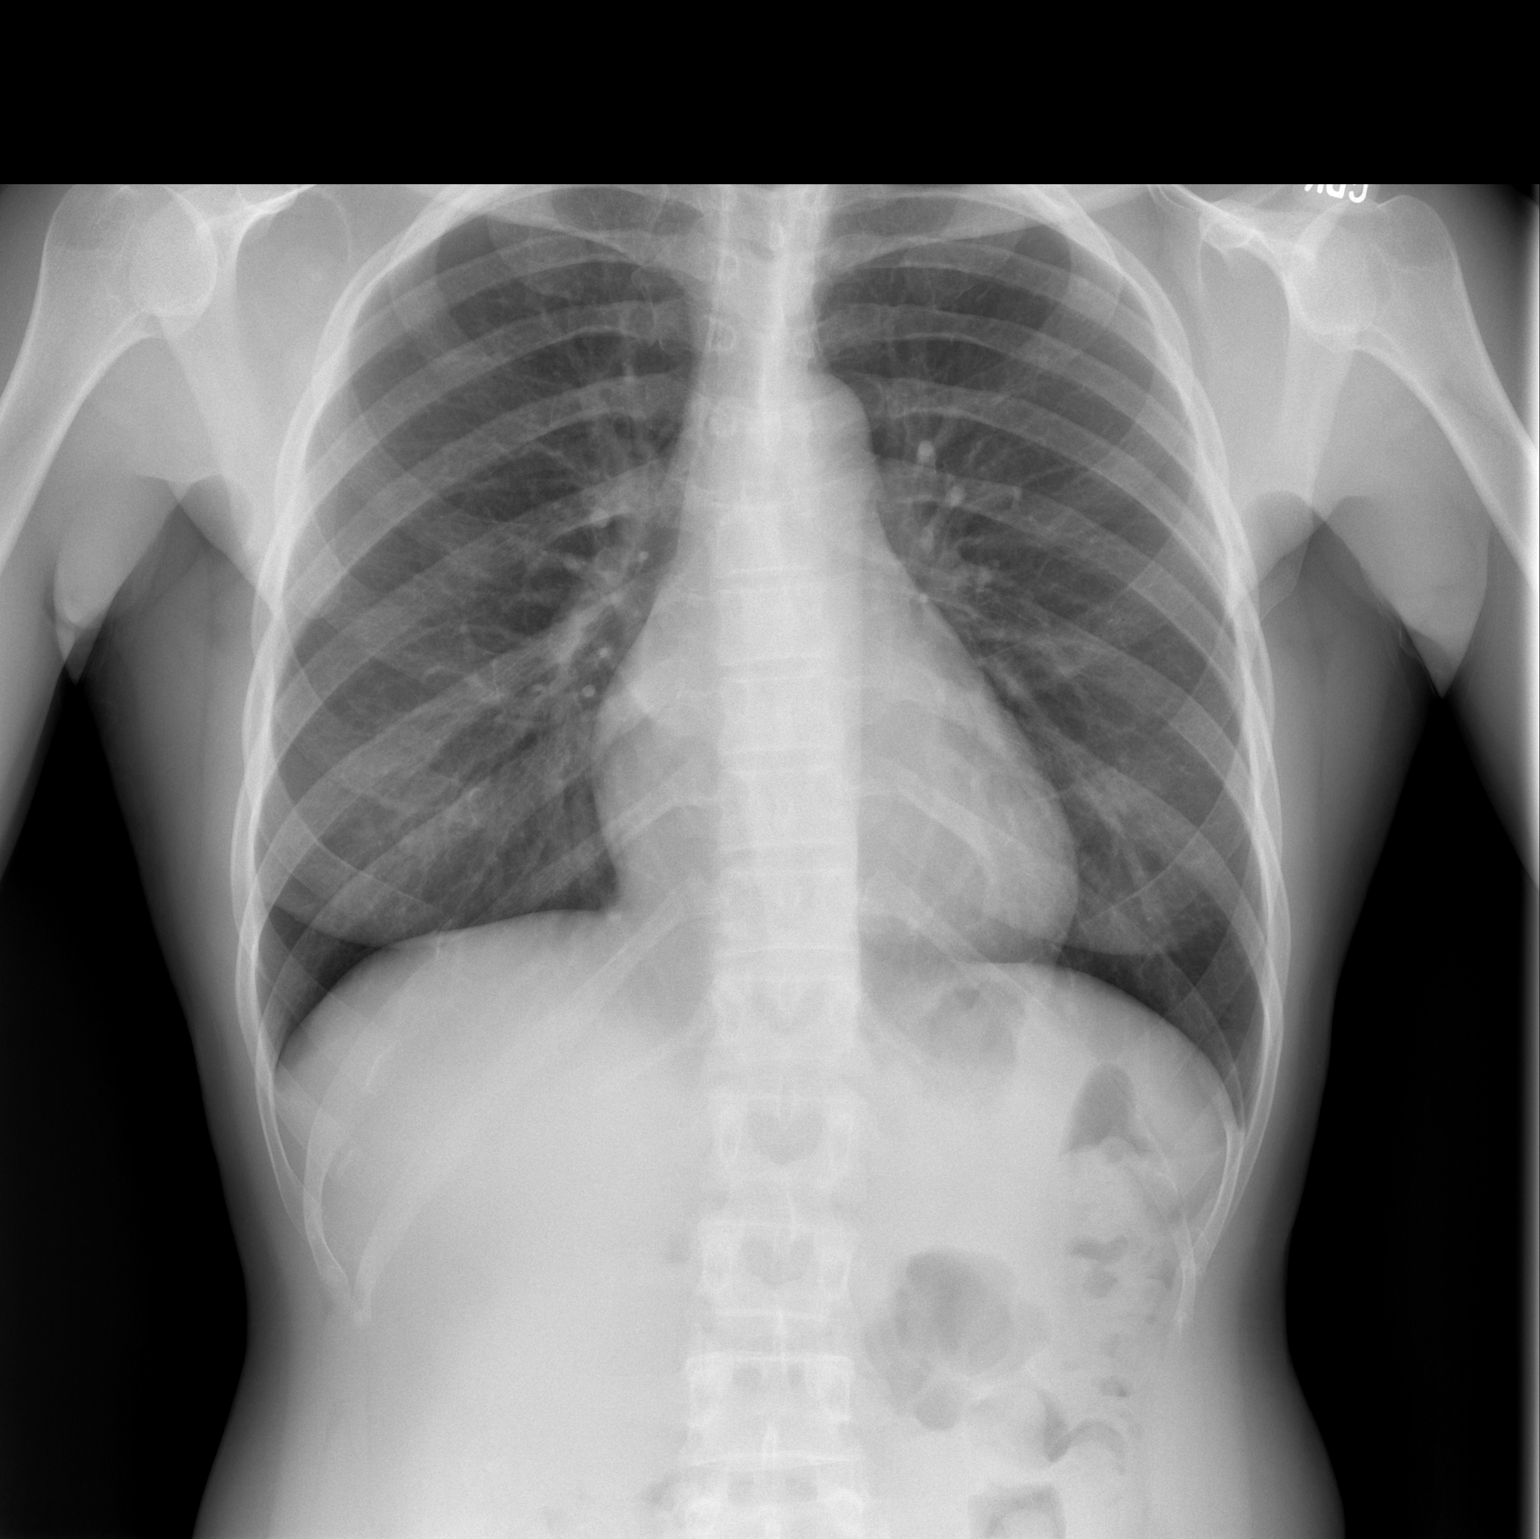

[w chest lat]
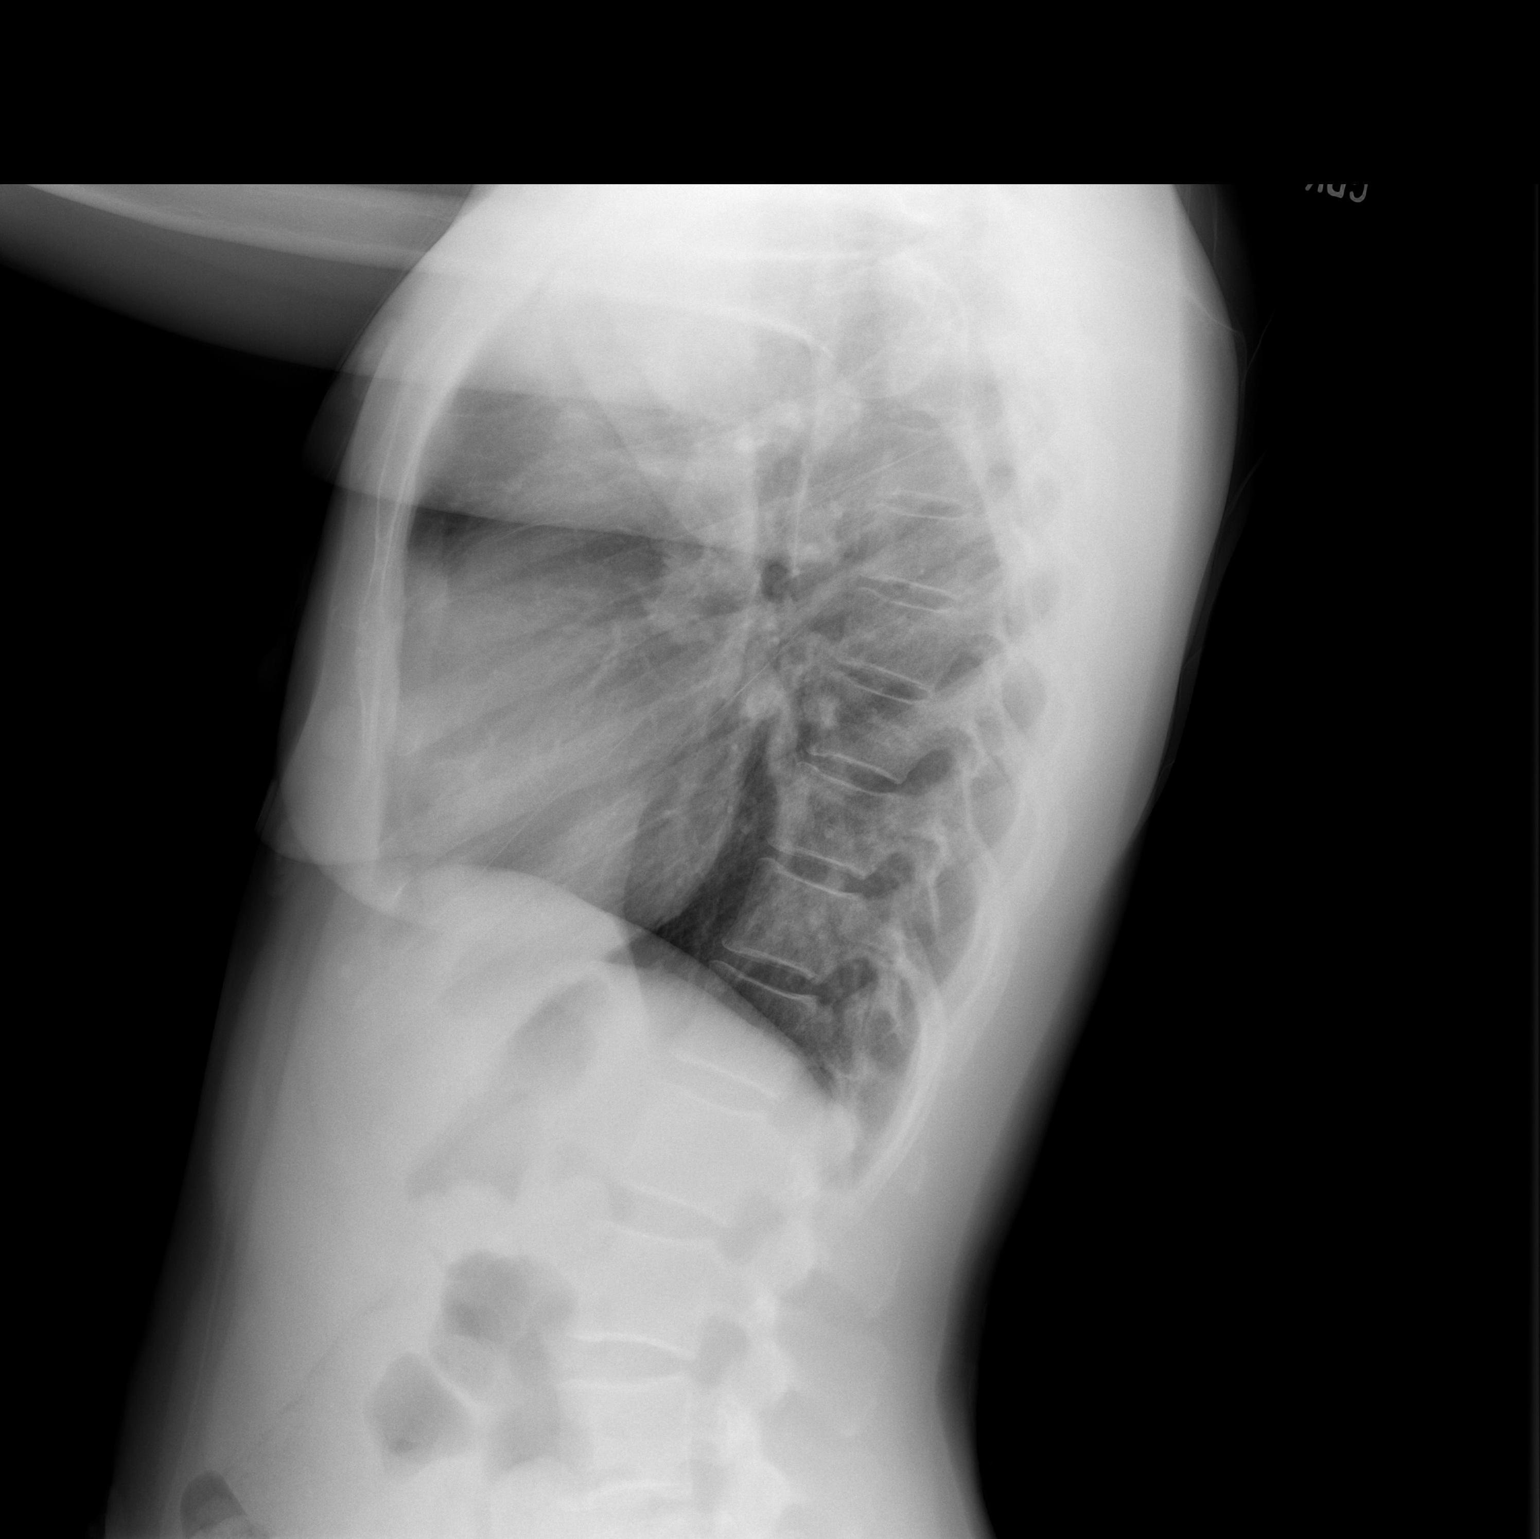

[2 of 2 positions shown; findings below may reference images not displayed]

FINDINGS: The heart size and mediastinal contours are within normal limits.
Both lungs are clear. The visualized skeletal structures are
unremarkable.
IMPRESSION: No active cardiopulmonary disease.

## 2022-08-14 ENCOUNTER — Other Ambulatory Visit: Payer: Self-pay

## 2022-08-14 ENCOUNTER — Ambulatory Visit
Admission: RE | Admit: 2022-08-14 | Discharge: 2022-08-14 | Disposition: A | Discharge status: Home / Self Care | Payer: BC Managed Care – PPO | Source: Ambulatory Visit | Attending: Family Medicine | Admitting: Family Medicine

## 2022-08-14 VITALS — BP 141/105 | HR 65 | Temp 99.1°F | Resp 17

## 2022-08-14 DIAGNOSIS — J0100 Acute maxillary sinusitis, unspecified: Secondary | ICD-10-CM

## 2022-08-14 DIAGNOSIS — J309 Allergic rhinitis, unspecified: Secondary | ICD-10-CM | POA: Diagnosis not present

## 2022-08-14 DIAGNOSIS — R059 Cough, unspecified: Secondary | ICD-10-CM

## 2022-08-14 MED ORDER — BENZONATATE 200 MG PO CAPS: 200.0000 mg | ORAL_CAPSULE | Freq: Three times a day (TID) | ORAL | 0 refills | Status: AC | PRN

## 2022-08-14 MED ORDER — AMOXICILLIN 875 MG PO TABS: 875.0000 mg | ORAL_TABLET | Freq: Two times a day (BID) | ORAL | 0 refills | Status: AC

## 2022-08-14 MED ORDER — PREDNISONE 20 MG PO TABS: ORAL_TABLET | ORAL | 0 refills | Status: DC

## 2022-08-14 MED ORDER — FEXOFENADINE HCL 180 MG PO TABS: 180.0000 mg | ORAL_TABLET | Freq: Every day | ORAL | 0 refills | Status: DC

## 2022-08-14 NOTE — Discharge Instructions (Addendum)
Directed patient to take medications as directed with food to completion.  Advised patient to take prednisone and Allegra with first dose of amoxicillin for the next 5 of 7 days.  Advised may use Allegra as needed afterwards for concurrent postnasal drainage/drip.  Advised may use Tessalon daily or as needed for cough.  Encourage patient to increase daily water intake while taking these medications.  Advised if symptoms worsen and/or unresolved please follow-up with PCP or here for further evaluation.

## 2022-08-14 NOTE — ED Provider Notes (Signed)
[facility]    CSN: [id number] Arrival date & time: 08/14/22  1105      History   Chief Complaint Chief Complaint  Patient presents with   Facial Pain    APPT 1115am; Pressure   Cough   Nasal Congestion    HPI Amber Olsen is a 42 y.o. female.   HPI 42 year old female presents with facial pain/pressure, sinus nasal congestion, cough for 1 week.  PMH significant for obesity and migraines.  Past Medical History:  Diagnosis Date   COVID-19 06/2019   Migraines     Patient Active Problem List   Diagnosis Date Noted   COVID-19 06/2019    History reviewed. No pertinent surgical history.  OB History     Gravida  4   Para  1   Term  1   Preterm  0   AB  2   Living  1      SAB  1   IAB  1   Ectopic  0   Multiple  0   Live Births               Home Medications    Prior to Admission medications   Medication Sig Start Date End Date Taking? Authorizing Provider  amoxicillin (AMOXIL) 875 MG tablet Take 1 tablet (875 mg total) by mouth 2 (two) times daily for 7 days. 08/14/22 08/21/22 Yes Eliezer Lofts, FNP  benzonatate (TESSALON) 200 MG capsule Take 1 capsule (200 mg total) by mouth 3 (three) times daily as needed for up to 7 days. 08/14/22 08/21/22 Yes Eliezer Lofts, FNP  fexofenadine [facility] ALLERGY) 180 MG tablet Take 1 tablet (180 mg total) by mouth daily for 15 days. 08/14/22 08/29/22 Yes Eliezer Lofts, FNP  predniSONE (DELTASONE) 20 MG tablet Take 3 tabs PO daily x 5 days. 08/14/22  Yes Eliezer Lofts, FNP  fluconazole (DIFLUCAN) 150 MG tablet Take one tablet PO once.  May repeat in 72 hours. 12/30/21   Kandra Nicolas, MD  SRONYX 0.1-20 MG-MCG tablet Take 1 tablet by mouth daily. 12/31/20   [provider]    Family History Family History  Problem Relation Age of Onset   Healthy Mother    Healthy Father    Healthy Sister    Healthy Brother     Social History Social History   Tobacco Use   Smoking status: Never    Smokeless tobacco: Never  Vaping Use   Vaping Use: Never used  Substance Use Topics   Alcohol use: No   Drug use: No     Allergies   Patient has no known allergies.   Review of Systems Review of Systems  HENT:  Positive for congestion, postnasal drip, sinus pressure and sinus pain.   Respiratory:  Positive for cough.   All other systems reviewed and are negative.    Physical Exam Triage Vital Signs ED Triage Vitals  Enc Vitals Group     BP 08/14/22 1123 (!) 141/105     Pulse Rate 08/14/22 1123 65     Resp 08/14/22 1123 17     Temp 08/14/22 1123 99.1 F (37.3 C)     Temp Source 08/14/22 1123 Oral     SpO2 08/14/22 1123 97 %     Weight --      Height --      Head Circumference --      Peak Flow --      Pain Score 08/14/22 1124 10  Pain Loc --      Pain Edu? --      Excl. in [place]? --    No data found.  Updated Vital Signs BP (!) 141/105 (BP Location: Right Arm)   Pulse 65   Temp 99.1 F (37.3 C) (Oral)   Resp 17   SpO2 97%   Visual Acuity Right Eye Distance:   Left Eye Distance:   Bilateral Distance:    Right Eye Near:   Left Eye Near:    Bilateral Near:     Physical Exam Vitals and nursing note reviewed.  Constitutional:      Appearance: Normal appearance. She is obese.  HENT:     Head: Normocephalic and atraumatic.     Right Ear: Tympanic membrane and external ear normal.     Left Ear: Tympanic membrane and external ear normal.     Ears:     Comments: Significant eustachian tube dysfunction noted bilaterally    Nose:     Right Sinus: Maxillary sinus tenderness present.     Left Sinus: Maxillary sinus tenderness present.     Comments: Turbinates are erythematous/edematous    Mouth/Throat:     Mouth: Mucous membranes are moist.     Pharynx: Oropharynx is clear.     Comments: Significant amount of clear drainage of posterior oropharynx noted Eyes:     Extraocular Movements: Extraocular movements intact.     Conjunctiva/sclera:  Conjunctivae normal.     Pupils: Pupils are equal, round, and reactive to light.  Cardiovascular:     Rate and Rhythm: Normal rate and regular rhythm.     Pulses: Normal pulses.     Heart sounds: Normal heart sounds.  Pulmonary:     Effort: Pulmonary effort is normal.     Breath sounds: Normal breath sounds. No wheezing, rhonchi or rales.  Musculoskeletal:        General: Normal range of motion.     Cervical back: Normal range of motion and neck supple.  Skin:    General: Skin is warm and dry.  Neurological:     General: No focal deficit present.     Mental Status: She is alert and oriented to person, place, and time.      UC Treatments / Results  Labs (all labs ordered are listed, but only abnormal results are displayed) Labs Reviewed - No data to display  EKG   Radiology No results found.  Procedures Procedures (including critical care time)  Medications Ordered in UC Medications - No data to display  Initial Impression / Assessment and Plan / UC Course  I have reviewed the triage vital signs and the nursing notes.  Pertinent labs & imaging results that were available during my care of the patient were reviewed by me and considered in my medical decision making (see chart for details).     MDM: 1.  Acute maxillary sinusitis, recurrence not specified-Rx'd amoxicillin; 2.  Cough Rx prednisone, Tessalon; 3.  Allergic rhinitis-Rx'd Allegra. Directed patient to take medications as directed with food to completion.  Advised patient to take prednisone and Allegra with first dose of amoxicillin for the next 5 of 7 days.  Advised may use Allegra as needed afterwards for concurrent postnasal drainage/drip.  Advised may use Tessalon daily or as needed for cough.  Encourage patient to increase daily water intake while taking these medications.  Advised if symptoms worsen and/or unresolved please follow-up with PCP or here for further evaluation. Final Clinical Impressions(s) / UC  Diagnoses   Final diagnoses:  Allergic rhinitis, unspecified seasonality, unspecified trigger  Acute maxillary sinusitis, recurrence not specified  Cough, unspecified type     Discharge Instructions      Directed patient to take medications as directed with food to completion.  Advised patient to take prednisone and Allegra with first dose of amoxicillin for the next 5 of 7 days.  Advised may use Allegra as needed afterwards for concurrent postnasal drainage/drip.  Advised may use Tessalon daily or as needed for cough.  Encourage patient to increase daily water intake while taking these medications.  Advised if symptoms worsen and/or unresolved please follow-up with PCP or here for further evaluation.     ED Prescriptions     Medication Sig Dispense Auth. Provider   amoxicillin (AMOXIL) 875 MG tablet Take 1 tablet (875 mg total) by mouth 2 (two) times daily for 7 days. 14 tablet Trevor Iha, FNP   predniSONE (DELTASONE) 20 MG tablet Take 3 tabs PO daily x 5 days. 15 tablet Trevor Iha, FNP   fexofenadine [facility] ALLERGY) 180 MG tablet Take 1 tablet (180 mg total) by mouth daily for 15 days. 15 tablet Trevor Iha, FNP   benzonatate (TESSALON) 200 MG capsule Take 1 capsule (200 mg total) by mouth 3 (three) times daily as needed for up to 7 days. 40 capsule Trevor Iha, FNP      PDMP not reviewed this encounter.   Trevor Iha, FNP 08/14/22 1155

## 2022-08-14 NOTE — ED Triage Notes (Signed)
Pt c/o cough, congestion and facial pain/pressure x 1 week. Taking acetaminophen and allergy meds prn.

## 2022-12-10 ENCOUNTER — Ambulatory Visit
Admission: RE | Admit: 2022-12-10 | Discharge: 2022-12-10 | Disposition: A | Discharge status: Home / Self Care | Payer: BC Managed Care – PPO | Source: Ambulatory Visit | Attending: Family Medicine | Admitting: Family Medicine

## 2022-12-10 VITALS — BP 128/91 | HR 74 | Temp 98.5°F | Resp 14 | Ht 66.0 in | Wt 163.0 lb

## 2022-12-10 DIAGNOSIS — N898 Other specified noninflammatory disorders of vagina: Secondary | ICD-10-CM | POA: Diagnosis not present

## 2022-12-10 LAB — POCT URINALYSIS DIP (MANUAL ENTRY)
Bilirubin, UA: NEGATIVE
Glucose, UA: NEGATIVE mg/dL
Leukocytes, UA: NEGATIVE
Nitrite, UA: NEGATIVE
Protein Ur, POC: NEGATIVE mg/dL
Spec Grav, UA: 1.015
Urobilinogen, UA: 0.2 U/dL
pH, UA: 6

## 2022-12-10 NOTE — ED Triage Notes (Signed)
Vaginal itching x 2 weeks  Runny discharge - no color Urinary urgency  x 2 weeks No OTC  meds

## 2022-12-10 NOTE — ED Provider Notes (Signed)
[facility]    CSN: [id number] Arrival date & time: 12/10/22  2130      History   Chief Complaint Chief Complaint  Patient presents with   Vaginal Itching   Urinary Urgency    HPI Amber Olsen is a 42 y.o. female.   Patient complains of vaginal itching and clear runny discharge for about two weeks.  She has also had mild urinary urgency without dysuria.  She denies pelvic or abdominal pain, fever, nausea/vomiting, and rash.  Patient's last menstrual period was 12/05/2022 (exact date).   The history is provided by the patient.  Vaginal Itching This is a new problem. The current episode started more than 1 week ago. The problem occurs hourly. The problem has not changed since onset.Pertinent negatives include no abdominal pain. Nothing aggravates the symptoms. Nothing relieves the symptoms. She has tried nothing for the symptoms.    Past Medical History:  Diagnosis Date   COVID-19 06/2019   Migraines     Patient Active Problem List   Diagnosis Date Noted   COVID-19 06/2019    History reviewed. No pertinent surgical history.  OB History     Gravida  4   Para  1   Term  1   Preterm  0   AB  2   Living  1      SAB  1   IAB  1   Ectopic  0   Multiple  0   Live Births               Home Medications    Prior to Admission medications   Medication Sig Start Date End Date Taking? Authorizing Provider  fexofenadine (ALLEGRA ALLERGY) 180 MG tablet Take 1 tablet (180 mg total) by mouth daily for 15 days. Patient not taking: Reported on 12/10/2022 08/14/22 08/29/22  Trevor Iha, FNP  fluconazole (DIFLUCAN) 150 MG tablet Take one tablet PO once.  May repeat in 72 hours. Patient not taking: Reported on 12/10/2022 12/30/21   Lattie Haw, MD  predniSONE (DELTASONE) 20 MG tablet Take 3 tabs PO daily x 5 days. Patient not taking: Reported on 12/10/2022 08/14/22   Trevor Iha, FNP  SRONYX 0.1-20 MG-MCG tablet Take 1 tablet by mouth  daily. 12/31/20   [provider]    Family History Family History  Problem Relation Age of Onset   Healthy Mother    Healthy Father    Healthy Sister    Healthy Brother     Social History Social History   Tobacco Use   Smoking status: Never   Smokeless tobacco: Never  Vaping Use   Vaping Use: Never used  Substance Use Topics   Alcohol use: No   Drug use: No     Allergies   Patient has no known allergies.   Review of Systems Review of Systems  Constitutional:  Negative for chills, diaphoresis, fatigue and fever.  Gastrointestinal:  Negative for abdominal pain, nausea and vomiting.  Genitourinary:  Positive for urgency and vaginal discharge. Negative for dysuria, flank pain, frequency, genital sores, hematuria, pelvic pain, vaginal bleeding and vaginal pain.  Skin:  Negative for rash.  All other systems reviewed and are negative.    Physical Exam Triage Vital Signs ED Triage Vitals  Enc Vitals Group     BP 12/10/22 0952 (!) 128/91     Pulse Rate 12/10/22 0952 74     Resp 12/10/22 0952 14     Temp 12/10/22 0952 98.5  F (36.9 C)     Temp Source 12/10/22 0952 Oral     SpO2 12/10/22 0952 99 %     Weight 12/10/22 0954 163 lb (73.9 kg)     Height 12/10/22 0954 5\' 6"  (1.676 m)     Head Circumference --      Peak Flow --      Pain Score 12/10/22 0953 3     Pain Loc --      Pain Edu? --      Excl. in GC? --    No data found.  Updated Vital Signs BP (!) 128/91 (BP Location: Right Arm)   Pulse 74   Temp 98.5 F (36.9 C) (Oral)   Resp 14   Ht 5\' 6"  (1.676 m)   Wt 73.9 kg   LMP 12/05/2022 (Exact Date)   SpO2 99%   BMI 26.31 kg/m   Visual Acuity Right Eye Distance:   Left Eye Distance:   Bilateral Distance:    Right Eye Near:   Left Eye Near:    Bilateral Near:     Physical Exam Vitals and nursing note reviewed.  Constitutional:      General: She is not in acute distress. HENT:     Head: Normocephalic.     Nose: Nose normal.      Mouth/Throat:     Pharynx: Oropharynx is clear.  Eyes:     Conjunctiva/sclera: Conjunctivae normal.     Pupils: Pupils are equal, round, and reactive to light.  Cardiovascular:     Rate and Rhythm: Normal rate and regular rhythm.     Heart sounds: Normal heart sounds.  Pulmonary:     Breath sounds: Normal breath sounds.  Abdominal:     Palpations: Abdomen is soft.     Tenderness: There is no abdominal tenderness.  Musculoskeletal:     Cervical back: Neck supple.     Right lower leg: No edema.     Left lower leg: No edema.  Lymphadenopathy:     Cervical: No cervical adenopathy.  Skin:    General: Skin is warm and dry.     Findings: No rash.  Neurological:     Mental Status: She is alert.      UC Treatments / Results  Labs (all labs ordered are listed, but only abnormal results are displayed) Labs Reviewed  POCT URINALYSIS DIP (MANUAL ENTRY) - Abnormal; Notable for the following components:      Result Value   Ketones, POC UA trace (5) (*)    Blood, UA trace-intact (*)    All other components within normal limits  CERVICOVAGINAL ANCILLARY ONLY    EKG   Radiology No results found.  Procedures Procedures (including critical care time)  Medications Ordered in UC Medications - No data to display  Initial Impression / Assessment and Plan / UC Course  I have reviewed the triage vital signs and the nursing notes.  Pertinent labs & imaging results that were available during my care of the patient were reviewed by me and considered in my medical decision making (see chart for details).    Benign exam.  Labs as ordered above. Followup with Family Doctor if not improved in one week.   Final Clinical Impressions(s) / UC Diagnoses   Final diagnoses:  Vaginal discharge     Discharge Instructions      Await lab results to determine treatment. Avoid sexual contact until negative results are available.     ED Prescriptions  None       Lattie Haw,  MD 12/11/22 [phone]

## 2022-12-10 NOTE — Discharge Instructions (Signed)
Await lab results to determine treatment. Avoid sexual contact until negative results are available.

## 2022-12-13 LAB — CERVICOVAGINAL ANCILLARY ONLY
Bacterial Vaginitis (gardnerella): NEGATIVE
Candida Glabrata: NEGATIVE
Candida Vaginitis: NEGATIVE
Chlamydia: NEGATIVE
Comment: NEGATIVE
Comment: NEGATIVE
Comment: NEGATIVE
Comment: NEGATIVE
Comment: NEGATIVE
Comment: NORMAL
Neisseria Gonorrhea: NEGATIVE
Trichomonas: NEGATIVE

## 2022-12-16 DIAGNOSIS — Z01419 Encounter for gynecological examination (general) (routine) without abnormal findings: Secondary | ICD-10-CM | POA: Diagnosis not present

## 2022-12-16 DIAGNOSIS — Z1151 Encounter for screening for human papillomavirus (HPV): Secondary | ICD-10-CM | POA: Diagnosis not present

## 2022-12-16 DIAGNOSIS — Z1239 Encounter for other screening for malignant neoplasm of breast: Secondary | ICD-10-CM | POA: Diagnosis not present

## 2022-12-16 DIAGNOSIS — Z113 Encounter for screening for infections with a predominantly sexual mode of transmission: Secondary | ICD-10-CM | POA: Diagnosis not present

## 2023-01-07 DIAGNOSIS — Z1231 Encounter for screening mammogram for malignant neoplasm of breast: Secondary | ICD-10-CM | POA: Diagnosis not present

## 2023-08-26 ENCOUNTER — Ambulatory Visit: Payer: BC Managed Care – PPO | Admitting: Family Medicine

## 2023-09-09 ENCOUNTER — Ambulatory Visit (INDEPENDENT_AMBULATORY_CARE_PROVIDER_SITE_OTHER): Payer: BC Managed Care – PPO | Admitting: Family Medicine

## 2023-09-09 ENCOUNTER — Encounter: Payer: Self-pay | Admitting: Family Medicine

## 2023-09-09 VITALS — BP 125/86 | HR 69 | Ht 66.0 in | Wt 169.8 lb

## 2023-09-09 DIAGNOSIS — F321 Major depressive disorder, single episode, moderate: Secondary | ICD-10-CM | POA: Insufficient documentation

## 2023-09-09 DIAGNOSIS — Z1231 Encounter for screening mammogram for malignant neoplasm of breast: Secondary | ICD-10-CM | POA: Diagnosis not present

## 2023-09-09 DIAGNOSIS — J302 Other seasonal allergic rhinitis: Secondary | ICD-10-CM | POA: Insufficient documentation

## 2023-09-09 DIAGNOSIS — F411 Generalized anxiety disorder: Secondary | ICD-10-CM | POA: Insufficient documentation

## 2023-09-09 MED ORDER — FLUOXETINE HCL 10 MG PO TABS: 10.0000 mg | ORAL_TABLET | Freq: Every day | ORAL | 1 refills | Status: DC

## 2023-09-09 NOTE — Progress Notes (Signed)
 Established patient visit   Patient: Amber Olsen   DOB: 09-May-1981   43 y.o. Female  MRN: [id number] Visit Date: 09/09/2023  Today's healthcare provider: Bernice GORMAN Juneau, DO   Chief Complaint  Patient presents with   New Patient (Initial Visit)    Est. Care    SUBJECTIVE    Chief Complaint  Patient presents with   New Patient (Initial Visit)    Est. Care   HPI HPI     New Patient (Initial Visit)    Additional comments: Est. Care      Last edited by Duwaine Riggs, CMA on 09/09/2023  9:00 AM.      Pt presents to establish care. Has a pmh of MDD and GAD and is not currently on any medication. Is interested in starting medication today. Was previously on prozac  and did well with this.  Mammogram done 01/2023 negative   Family hx significant for breast cancer in Paternal Aunt   Review of Systems  Constitutional:  Negative for activity change, fatigue and fever.  Respiratory:  Negative for cough and shortness of breath.   Cardiovascular:  Negative for chest pain.  Gastrointestinal:  Negative for abdominal pain.  Genitourinary:  Negative for difficulty urinating.       Current Meds  Medication Sig   FLUoxetine  (PROZAC ) 10 MG tablet Take 1 tablet (10 mg total) by mouth daily.    OBJECTIVE    BP 125/86 (BP Location: Left Arm, Patient Position: Sitting, Cuff Size: Normal)   Pulse 69   Ht 5' 6 (1.676 m)   Wt 169 lb 12 oz (77 kg)   SpO2 100%   BMI 27.40 kg/m   Physical Exam Vitals and nursing note reviewed.  Constitutional:      General: She is not in acute distress.    Appearance: Normal appearance.  HENT:     Head: Normocephalic and atraumatic.     Right Ear: External ear normal.     Left Ear: External ear normal.     Nose: Nose normal.  Eyes:     Conjunctiva/sclera: Conjunctivae normal.  Cardiovascular:     Rate and Rhythm: Normal rate and regular rhythm.  Pulmonary:     Effort: Pulmonary effort is normal.     Breath sounds: Normal breath  sounds.  Neurological:     General: No focal deficit present.     Mental Status: She is alert and oriented to person, place, and time.  Psychiatric:        Mood and Affect: Mood normal.        Behavior: Behavior normal.        Thought Content: Thought content normal.        Judgment: Judgment normal.        ASSESSMENT & PLAN    Problem List Items Addressed This Visit       Respiratory   Seasonal allergic rhinitis   Well controlled per patient as she takes a daily zyrtec        Other   Current moderate episode of major depressive disorder without prior episode (HCC) - Primary   Denies homicidal/suicidal ideation - will go ahead and start prozac  10mg  and she will follow up in 6 weeks for efficacy      Relevant Medications   FLUoxetine  (PROZAC ) 10 MG tablet   GAD (generalized anxiety disorder)   Admits to an increase in anxiety symptoms      Relevant Medications   FLUoxetine  (PROZAC ) 10  MG tablet   Other Visit Diagnoses       Encounter for screening mammogram for malignant neoplasm of breast       Relevant Orders   MM DIGITAL SCREENING BILATERAL       Return in about 6 weeks (around 10/21/2023) for for MDD and GAD.      Meds ordered this encounter  Medications   FLUoxetine  (PROZAC ) 10 MG tablet    Sig: Take 1 tablet (10 mg total) by mouth daily.    Dispense:  30 tablet    Refill:  1    Orders Placed This Encounter  Procedures   MM DIGITAL SCREENING BILATERAL    Standing Status:   Future    Expiration Date:   09/08/2024    Is the patient pregnant?:   No    Preferred imaging location?:   [facility]    Reason for exam::   screening breast cancer    Release to patient:   Immediate     Bernice GORMAN Juneau, DO  [facility] at [facility] [phone] (phone) [phone] (fax)  [facility]

## 2023-09-09 NOTE — Assessment & Plan Note (Signed)
 Denies homicidal/suicidal ideation - will go ahead and start prozac  10mg  and she will follow up in 6 weeks for efficacy

## 2023-09-09 NOTE — Assessment & Plan Note (Signed)
 Admits to an increase in anxiety symptoms

## 2023-09-09 NOTE — Assessment & Plan Note (Signed)
 Well controlled per patient as she takes a daily zyrtec

## 2023-10-02 ENCOUNTER — Other Ambulatory Visit: Payer: Self-pay | Admitting: Family Medicine

## 2023-10-12 ENCOUNTER — Ambulatory Visit
Admission: RE | Admit: 2023-10-12 | Discharge: 2023-10-12 | Disposition: A | Discharge status: Home / Self Care | Source: Ambulatory Visit | Attending: Family Medicine | Admitting: Family Medicine

## 2023-10-12 VITALS — BP 137/99 | HR 75 | Temp 98.2°F | Resp 16

## 2023-10-12 DIAGNOSIS — R059 Cough, unspecified: Secondary | ICD-10-CM

## 2023-10-12 MED ORDER — AZITHROMYCIN 250 MG PO TABS: 250.0000 mg | ORAL_TABLET | Freq: Every day | ORAL | 0 refills | Status: DC

## 2023-10-12 MED ORDER — PREDNISONE 20 MG PO TABS: ORAL_TABLET | ORAL | 0 refills | Status: DC

## 2023-10-12 NOTE — ED Triage Notes (Signed)
 Pt presents to uc with co of cough, congestion and sob since Monday. Pt reports no otc and no fevers.

## 2023-10-12 NOTE — ED Provider Notes (Signed)
 [facility]    CSN: [id number] Arrival date & time: 10/12/23  0803      History   Chief Complaint Chief Complaint  Patient presents with   Cough    Entered by patient    HPI Amber Olsen is a 43 y.o. female.   HPI 43 year old female presents with cough and congestion with shortness of breath for 2 days.  PMH significant for GAD, migraines and current moderate episode of major depressive disorder without prior episode.  Past Medical History:  Diagnosis Date   COVID-19 06/2019   Migraines     Patient Active Problem List   Diagnosis Date Noted   Current moderate episode of major depressive disorder without prior episode (HCC) 09/09/2023   GAD (generalized anxiety disorder) 09/09/2023   Seasonal allergic rhinitis 09/09/2023   COVID-19 06/2019    History reviewed. No pertinent surgical history.  OB History     Gravida  4   Para  1   Term  1   Preterm  0   AB  2   Living  1      SAB  1   IAB  1   Ectopic  0   Multiple  0   Live Births               Home Medications    Prior to Admission medications   Medication Sig Start Date End Date Taking? Authorizing Provider  azithromycin (ZITHROMAX) 250 MG tablet Take 1 tablet (250 mg total) by mouth daily. Take first 2 tablets together, then 1 every day until finished. 10/12/23  Yes Trevor Iha, FNP  predniSONE (DELTASONE) 20 MG tablet Take 3 tabs PO daily x 5 days. 10/12/23  Yes Trevor Iha, FNP  FLUoxetine (PROZAC) 10 MG tablet TAKE 1 TABLET BY MOUTH EVERY DAY 10/03/23   Monica Becton, MD    Family History Family History  Problem Relation Age of Onset   Healthy Mother    Healthy Father    Healthy Sister    Healthy Brother     Social History Social History   Tobacco Use   Smoking status: Never   Smokeless tobacco: Never  Vaping Use   Vaping status: Never Used  Substance Use Topics   Alcohol use: No   Drug use: No     Allergies   Patient has no known  allergies.   Review of Systems Review of Systems  Respiratory:  Positive for cough.   All other systems reviewed and are negative.    Physical Exam Triage Vital Signs ED Triage Vitals  Encounter Vitals Group     BP 10/12/23 0825 (!) 137/99     Systolic BP Percentile --      Diastolic BP Percentile --      Pulse Rate 10/12/23 0825 75     Resp 10/12/23 0825 16     Temp 10/12/23 0825 98.2 F (36.8 C)     Temp src --      SpO2 10/12/23 0825 98 %     Weight --      Height --      Head Circumference --      Peak Flow --      Pain Score 10/12/23 0823 5     Pain Loc --      Pain Education --      Exclude from Growth Chart --    No data found.  Updated Vital Signs BP (!) 137/99   Pulse  75   Temp 98.2 F (36.8 C)   Resp 16   LMP 09/17/2023   SpO2 98%    Physical Exam Vitals and nursing note reviewed.  Constitutional:      General: She is not in acute distress.    Appearance: Normal appearance. She is obese. She is not ill-appearing.  HENT:     Head: Normocephalic and atraumatic.     Right Ear: Tympanic membrane, ear canal and external ear normal.     Left Ear: Tympanic membrane, ear canal and external ear normal.     Mouth/Throat:     Mouth: Mucous membranes are moist.     Pharynx: Oropharynx is clear.  Eyes:     Extraocular Movements: Extraocular movements intact.     Conjunctiva/sclera: Conjunctivae normal.     Pupils: Pupils are equal, round, and reactive to light.  Cardiovascular:     Rate and Rhythm: Normal rate and regular rhythm.     Pulses: Normal pulses.     Heart sounds: Normal heart sounds.  Pulmonary:     Effort: Pulmonary effort is normal.     Breath sounds: No wheezing, rhonchi or rales.  Musculoskeletal:        General: Normal range of motion.     Cervical back: Normal range of motion and neck supple.  Skin:    General: Skin is warm and dry.  Neurological:     General: No focal deficit present.     Mental Status: She is alert and oriented  to person, place, and time. Mental status is at baseline.  Psychiatric:        Mood and Affect: Mood normal.        Behavior: Behavior normal.      UC Treatments / Results  Labs (all labs ordered are listed, but only abnormal results are displayed) Labs Reviewed - No data to display  EKG   Radiology No results found.  Procedures Procedures (including critical care time)  Medications Ordered in UC Medications - No data to display  Initial Impression / Assessment and Plan / UC Course  I have reviewed the triage vital signs and the nursing notes.  Pertinent labs & imaging results that were available during my care of the patient were reviewed by me and considered in my medical decision making (see chart for details).     MDM: 1.  Cough, unspecified type-Rx'd prednisone 20 mg tablet: Take 3 tablets p.o. daily x 5 days, Rx'd Zithromax 500 mg day 1, then 250 mg day 2-5. Advised patient to take medication (prednisone) daily for the next 5 days.  Advised if cough worsens in the next 4 to 5 days it may start Zithromax.  Encouraged increase daily water intake to 64 ounces per day while taking these medications.  Advised if symptoms worsen and/or unresolved please follow-up with your PCP or here for further evaluation.  Patient discharged home, hemodynamically stable. Final Clinical Impressions(s) / UC Diagnoses   Final diagnoses:  Cough, unspecified type     Discharge Instructions      Advised patient to take medication (prednisone) daily for the next 5 days.  Advised if cough worsens in the next 4 to 5 days it may start Zithromax.  Encouraged increase daily water intake to 64 ounces per day while taking these medications.  Advised if symptoms worsen and/or unresolved please follow-up with your PCP or here for further evaluation.     ED Prescriptions     Medication Sig Dispense Auth.  Provider   predniSONE (DELTASONE) 20 MG tablet Take 3 tabs PO daily x 5 days. 15 tablet  Trevor Iha, FNP   azithromycin (ZITHROMAX) 250 MG tablet Take 1 tablet (250 mg total) by mouth daily. Take first 2 tablets together, then 1 every day until finished. 6 tablet Trevor Iha, FNP      PDMP not reviewed this encounter.   Trevor Iha, FNP 10/12/23 [phone]

## 2023-10-12 NOTE — Discharge Instructions (Addendum)
 Advised patient to take medication (prednisone) daily for the next 5 days.  Advised if cough worsens in the next 4 to 5 days it may start Zithromax.  Encouraged increase daily water intake to 64 ounces per day while taking these medications.  Advised if symptoms worsen and/or unresolved please follow-up with your PCP or here for further evaluation.

## 2023-10-21 ENCOUNTER — Ambulatory Visit: Payer: BC Managed Care – PPO | Admitting: Family Medicine

## 2023-10-21 ENCOUNTER — Encounter: Payer: Self-pay | Admitting: Family Medicine

## 2023-10-21 VITALS — BP 124/84 | HR 70 | Temp 98.8°F | Ht 66.0 in | Wt 170.0 lb

## 2023-10-21 DIAGNOSIS — F321 Major depressive disorder, single episode, moderate: Secondary | ICD-10-CM | POA: Diagnosis not present

## 2023-10-21 DIAGNOSIS — F411 Generalized anxiety disorder: Secondary | ICD-10-CM

## 2023-10-21 NOTE — Assessment & Plan Note (Signed)
 Taking Prozac 10 mg daily as prescribed. Reports symptoms are improving.  PHQ-9: 1 GAD-7: 14 No thoughts of self harm. Does not want to  increase dose due to side effects. Makes her feel "out of it" No refills needed.  Follow-up with PCP in 3 months.

## 2023-10-21 NOTE — Progress Notes (Signed)
 Established Patient Office Visit  Subjective   Patient ID: Amber Olsen, female    DOB: 23-Feb-1981  Age: 43 y.o. MRN: [id number]  Chief Complaint  Patient presents with   Depression    6 week follow up    HPI  Presents to follow-up for depression and anxiety.  Taking Prozac 10 mg daily as prescribed. Symptoms have improved. Sleep and appetite is normal.  No side effects reported. No thoughts of self-harm. No refills needed.  Wants to stay on same dose.  Declines counseling referral today.     Chart review: 09/09/23:  MDD/GAD: Started Prozac 10 mg daily.    Review of Systems  Psychiatric/Behavioral:  Positive for depression. Negative for suicidal ideas. The patient is nervous/anxious.       Objective:     BP 124/84 (BP Location: Right Arm, Patient Position: Sitting, Cuff Size: Normal)   Pulse 70   Temp 98.8 F (37.1 C) (Oral)   Ht 5\' 6"  (1.676 m)   Wt 170 lb (77.1 kg)   LMP 10/15/2023 (Approximate)   SpO2 100%   BMI 27.44 kg/m  BP Readings from Last 3 Encounters:  10/21/23 124/84  10/12/23 (!) 137/99  09/09/23 125/86      Physical Exam Vitals and nursing note reviewed.  Constitutional:      General: She is not in acute distress.    Appearance: Normal appearance.  Cardiovascular:     Rate and Rhythm: Normal rate.  Pulmonary:     Effort: Pulmonary effort is normal.  Skin:    General: Skin is warm and dry.  Neurological:     General: No focal deficit present.     Mental Status: She is alert. Mental status is at baseline.  Psychiatric:        Mood and Affect: Mood normal.        Behavior: Behavior normal.        Thought Content: Thought content normal.        Judgment: Judgment normal.       10/21/2023    8:29 AM  Depression screen PHQ 2/9  Decreased Interest 0  Down, Depressed, Hopeless 0  PHQ - 2 Score 0  Altered sleeping 0  Tired, decreased energy 1  Change in appetite 0  Feeling bad or failure about yourself  0  Trouble  concentrating 0  Moving slowly or fidgety/restless 0  Suicidal thoughts 0  PHQ-9 Score 1  Difficult doing work/chores Not difficult at all      10/21/2023    8:36 AM  GAD 7 : Generalized Anxiety Score  Nervous, Anxious, on Edge 1  Control/stop worrying 2  Worry too much - different things 2  Trouble relaxing 1  Restless 2  Easily annoyed or irritable 3  Afraid - awful might happen 3  Total GAD 7 Score 14      No results found for any visits on 10/21/23.    The ASCVD Risk score (Arnett DK, et al., 2019) failed to calculate for the following reasons:   Cannot find a previous HDL lab   Cannot find a previous total cholesterol lab    Assessment & Plan:   Problem List Items Addressed This Visit     Current moderate episode of major depressive disorder without prior episode (HCC) - Primary   Taking Prozac 10 mg daily as prescribed. Reports symptoms are improving.  PHQ-9: 1 GAD-7: 14 No thoughts of self harm. Does not want to  increase dose due to  side effects. Makes her feel "out of it" No refills needed.  Follow-up with PCP in 3 months.        GAD (generalized anxiety disorder)    No follow-ups on file.    Novella Olive, FNP

## 2023-12-08 ENCOUNTER — Encounter: Payer: Self-pay | Admitting: Family Medicine

## 2024-01-06 DIAGNOSIS — Z113 Encounter for screening for infections with a predominantly sexual mode of transmission: Secondary | ICD-10-CM | POA: Diagnosis not present

## 2024-01-06 DIAGNOSIS — Z1239 Encounter for other screening for malignant neoplasm of breast: Secondary | ICD-10-CM | POA: Diagnosis not present

## 2024-01-06 DIAGNOSIS — Z01419 Encounter for gynecological examination (general) (routine) without abnormal findings: Secondary | ICD-10-CM | POA: Diagnosis not present

## 2024-01-13 DIAGNOSIS — Z1231 Encounter for screening mammogram for malignant neoplasm of breast: Secondary | ICD-10-CM | POA: Diagnosis not present

## 2024-01-13 DIAGNOSIS — R92333 Mammographic heterogeneous density, bilateral breasts: Secondary | ICD-10-CM | POA: Diagnosis not present

## 2024-06-27 ENCOUNTER — Ambulatory Visit (INDEPENDENT_AMBULATORY_CARE_PROVIDER_SITE_OTHER)

## 2024-06-27 ENCOUNTER — Ambulatory Visit (HOSPITAL_BASED_OUTPATIENT_CLINIC_OR_DEPARTMENT_OTHER): Admitting: Orthopaedic Surgery

## 2024-06-27 DIAGNOSIS — M25512 Pain in left shoulder: Secondary | ICD-10-CM

## 2024-06-27 DIAGNOSIS — G8929 Other chronic pain: Secondary | ICD-10-CM

## 2024-06-27 MED ORDER — LIDOCAINE HCL 1 % IJ SOLN
4.0000 mL | INTRAMUSCULAR | Status: AC | PRN
  Administered 2024-06-27: 4 mL

## 2024-06-27 MED ORDER — TRIAMCINOLONE ACETONIDE 40 MG/ML IJ SUSP
80.0000 mg | INTRAMUSCULAR | Status: AC | PRN
  Administered 2024-06-27: 80 mg via INTRA_ARTICULAR

## 2024-06-27 NOTE — Progress Notes (Signed)
 Chief Complaint: Left shoulder pain     History of Present Illness:    Amber Olsen is a 43 y.o. female left hand dominant female presents with ongoing left shoulder pain atraumatic for the last year.  She has been having difficulty with overhead range of motion particular the external rotation at side.  She is active but is having a hard time using this arm.  This is inhibiting her ability at work    PMH/PSH/Family History/Social History/Meds/Allergies:    Past Medical History:  Diagnosis Date  . COVID-19 06/2019  . Migraines    No past surgical history on file. Social History   Socioeconomic History  . Marital status: Single    Spouse name: Not on file  . Number of children: Not on file  . Years of education: Not on file  . Highest education level: Not on file  Occupational History  . Not on file  Tobacco Use  . Smoking status: Never  . Smokeless tobacco: Never  Vaping Use  . Vaping status: Never Used  Substance and Sexual Activity  . Alcohol use: No  . Drug use: No  . Sexual activity: Yes    Birth control/protection: Pill  Other Topics Concern  . Not on file  Social History Narrative  . Not on file   Social Drivers of Health   Financial Resource Strain: Not on file  Food Insecurity: Not on file  Transportation Needs: Not on file  Physical Activity: Not on file  Stress: Not on file  Social Connections: Not on file   Family History  Problem Relation Age of Onset  . Healthy Mother   . Healthy Father   . Healthy Sister   . Healthy Brother    No Known Allergies Current Outpatient Medications  Medication Sig Dispense Refill  . FLUoxetine  (PROZAC ) 10 MG tablet TAKE 1 TABLET BY MOUTH EVERY DAY 90 tablet 3   No current facility-administered medications for this visit.   No results found.  Review of Systems:   A ROS was performed including pertinent positives and negatives as documented in the HPI.  Physical Exam :   Constitutional: NAD and  appears stated age Neurological: Alert and oriented Psych: Appropriate affect and cooperative There were no vitals taken for this visit.   Comprehensive Musculoskeletal Exam:    Left shoulder with the pain in the glenohumeral joint pain there is passive pain with external rotation at side to 70 degrees forward elevation is to 140 degrees compared to 160 on the right internal rotation is to back pocket   Imaging:   Xray (3 views left shoulder): Normal    I personally reviewed and interpreted the radiographs.   Assessment and Plan:   43 y.o. female with evidence of left shoulder pain consistent with early adhesive capsulitis.  Given this I have recommended an ultrasound-guided injection of the left shoulder which she would like to proceed with today after verbal consent was obtained.  I will plan to see her back in 2 weeks  -Left shoulder ultrasound-guided injection  Verbal send obtained    Procedure Note  Patient: Amber Olsen             Date of Birth: 26-Sep-1980           MRN: [id number]             Visit Date: 06/27/2024  Procedures: Visit Diagnoses:  1. Chronic left shoulder pain     Large Joint Inj: L glenohumeral  on 06/27/2024 4:46 PM Indications: pain Details: 22 G 1.5 in needle, ultrasound-guided anterior approach  Arthrogram: No  Medications: 4 mL lidocaine  1 %; 80 mg triamcinolone  acetonide 40 MG/ML Outcome: tolerated well, no immediate complications Procedure, treatment alternatives, risks and benefits explained, specific risks discussed. Consent was given by the patient. Immediately prior to procedure a time out was called to verify the correct patient, procedure, equipment, support staff and site/side marked as required. Patient was prepped and draped in the usual sterile fashion.         I personally saw and evaluated the patient, and participated in the management and treatment plan.  Elspeth Parker, MD Attending Physician, Orthopedic  Surgery  This document was dictated using Dragon voice recognition software. A reasonable attempt at proof reading has been made to minimize errors.

## 2024-07-13 ENCOUNTER — Other Ambulatory Visit (HOSPITAL_BASED_OUTPATIENT_CLINIC_OR_DEPARTMENT_OTHER): Payer: Self-pay

## 2024-07-13 ENCOUNTER — Ambulatory Visit (INDEPENDENT_AMBULATORY_CARE_PROVIDER_SITE_OTHER): Admitting: Student

## 2024-07-13 DIAGNOSIS — M25512 Pain in left shoulder: Secondary | ICD-10-CM | POA: Diagnosis not present

## 2024-07-13 DIAGNOSIS — G8929 Other chronic pain: Secondary | ICD-10-CM

## 2024-07-13 MED ORDER — LIDOCAINE HCL 1 % IJ SOLN
4.0000 mL | INTRAMUSCULAR | Status: AC | PRN
  Administered 2024-07-13: 4 mL

## 2024-07-13 NOTE — Progress Notes (Signed)
 Chief Complaint: Left shoulder pain     History of Present Illness:    Amber Olsen is a 44 y.o. female left hand dominant female who presents today for follow-up evaluation of her left shoulder.  Patient was seen by Dr. Genelle on 11/26 and at that time received a glenohumeral cortisone injection for suspected adhesive capsulitis.  Today she reports that the injection has helped significantly and her range of motion is greatly improved.  She experiences some intermittent pain within the shoulder although not as severe, and overall reports feeling about 80% better.   PMH/PSH/Family History/Social History/Meds/Allergies:    Past Medical History:  Diagnosis Date   COVID-19 06/2019   Migraines    No past surgical history on file. Social History   Socioeconomic History   Marital status: Single    Spouse name: Not on file   Number of children: Not on file   Years of education: Not on file   Highest education level: Not on file  Occupational History   Not on file  Tobacco Use   Smoking status: Never   Smokeless tobacco: Never  Vaping Use   Vaping status: Never Used  Substance and Sexual Activity   Alcohol use: No   Drug use: No   Sexual activity: Yes    Birth control/protection: Pill  Other Topics Concern   Not on file  Social History Narrative   Not on file   Social Drivers of Health   Tobacco Use: Low Risk (01/06/2024)   Received from [facility]   Patient History    Smoking Tobacco Use: Never    Smokeless Tobacco Use: Never    Passive Exposure: Not on file  Financial Resource Strain: Not on file  Food Insecurity: Not on file  Transportation Needs: Not on file  Physical Activity: Not on file  Stress: Not on file  Social Connections: Not on file  Depression (PHQ2-9): Low Risk (10/21/2023)   Depression (PHQ2-9)    PHQ-2 Score: 1  Alcohol Screen: Not on file  Housing: Not on file  Utilities: Not on file  Health Literacy: Not on file   Family History   Problem Relation Age of Onset   Healthy Mother    Healthy Father    Healthy Sister    Healthy Brother    No Known Allergies Current Outpatient Medications  Medication Sig Dispense Refill   FLUoxetine  (PROZAC ) 10 MG tablet TAKE 1 TABLET BY MOUTH EVERY DAY 90 tablet 3   No current facility-administered medications for this visit.   No results found.  Review of Systems:   A ROS was performed including pertinent positives and negatives as documented in the HPI.  Physical Exam :   Constitutional: NAD and appears stated age Neurological: Alert and oriented Psych: Appropriate affect and cooperative There were no vitals taken for this visit.   Comprehensive Musculoskeletal Exam:    Exam the left shoulder demonstrates active range of motion 160 degrees flexion, 70 degrees external rotation, and internal rotation to L4 bilaterally.  No significant tenderness with palpation throughout the shoulder.  Full cervical range of motion.   Imaging:     Assessment and Plan:   43 y.o. female following up today on chronic left shoulder pain.  This does appear consistent with adhesive capsulitis particularly given her relief from glenohumeral cortisone injection at last visit with essentially normalization of her range of motion.  She does continue to have some intermittent discomfort and we did discuss treatment options moving  forward for this today.  After discussion patient would like to proceed with a repeat injection of the left shoulder today in order to hopefully obtain more complete relief.  Injection was performed under ultrasound guidance and patient tolerated this very well.  Recommend continued use and range of motion of the shoulder and will have her follow-up as needed.      Procedure Note  Patient: Amber Olsen             Date of Birth: Jun 10, 1981           MRN: [id number]             Visit Date: 07/13/2024  Procedures: Visit Diagnoses:  1. Chronic left shoulder pain       Large Joint Inj: L glenohumeral on 07/13/2024 9:46 AM Indications: pain Details: 22 G 1.5 in needle, ultrasound-guided anterior approach Medications: 4 mL lidocaine  1 % (2 mL betamethasone) Outcome: tolerated well, no immediate complications Procedure, treatment alternatives, risks and benefits explained, specific risks discussed. Consent was given by the patient. Immediately prior to procedure a time out was called to verify the correct patient, procedure, equipment, support staff and site/side marked as required. Patient was prepped and draped in the usual sterile fashion.       I personally saw and evaluated the patient, and participated in the management and treatment plan.   Leonce Reveal, PA-C Orthopedics  This document was dictated using Conservation officer, historic buildings. A reasonable attempt at proof reading has been made to minimize errors.
